# Patient Record
Sex: Female | Born: 1965 | Race: Black or African American | Hispanic: No | Marital: Married | Smoking: Never smoker
Health system: Southern US, Community
[De-identification: ages and names within clinical notes are randomized; demographics above are authoritative.]

## PROBLEM LIST (undated history)

## (undated) DIAGNOSIS — D649 Anemia, unspecified: Secondary | ICD-10-CM

## (undated) DIAGNOSIS — A64 Unspecified sexually transmitted disease: Secondary | ICD-10-CM

## (undated) HISTORY — DX: Anemia, unspecified: D64.9

## (undated) HISTORY — DX: Unspecified sexually transmitted disease: A64

---
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
## 2011-04-14 ENCOUNTER — Other Ambulatory Visit: Payer: Self-pay | Admitting: Obstetrics and Gynecology

## 2011-04-14 DIAGNOSIS — Z1231 Encounter for screening mammogram for malignant neoplasm of breast: Secondary | ICD-10-CM

## 2011-04-23 ENCOUNTER — Ambulatory Visit: Payer: Self-pay

## 2013-02-01 ENCOUNTER — Encounter: Payer: Self-pay | Admitting: Obstetrics and Gynecology

## 2013-02-15 ENCOUNTER — Ambulatory Visit (INDEPENDENT_AMBULATORY_CARE_PROVIDER_SITE_OTHER): Payer: BC Managed Care – PPO | Admitting: Obstetrics and Gynecology

## 2013-02-15 ENCOUNTER — Encounter: Payer: Self-pay | Admitting: Obstetrics and Gynecology

## 2013-02-15 ENCOUNTER — Other Ambulatory Visit: Payer: Self-pay | Admitting: Obstetrics and Gynecology

## 2013-02-15 VITALS — BP 120/78 | Ht 66.5 in | Wt 137.0 lb

## 2013-02-15 DIAGNOSIS — Z1231 Encounter for screening mammogram for malignant neoplasm of breast: Secondary | ICD-10-CM

## 2013-02-15 DIAGNOSIS — Z0000 Encounter for general adult medical examination without abnormal findings: Secondary | ICD-10-CM

## 2013-02-15 DIAGNOSIS — Z01419 Encounter for gynecological examination (general) (routine) without abnormal findings: Secondary | ICD-10-CM

## 2013-02-15 LAB — CBC
Hemoglobin: 9.3 g/dL — ABNORMAL LOW (ref 12.0–15.0)
MCH: 25.5 pg — ABNORMAL LOW (ref 26.0–34.0)
MCHC: 32.6 g/dL (ref 30.0–36.0)
MCV: 78.1 fL (ref 78.0–100.0)
RBC: 3.65 MIL/uL — ABNORMAL LOW (ref 3.87–5.11)

## 2013-02-15 NOTE — Progress Notes (Signed)
Patient ID: Tiffany Higgins, female   DOB: 08-02-1966, 47 y.o.   MRN: [id number] 47 y.o.  Married  [place] female   [phone] here for annual exam.   Menses normal.  No problems.    Patient's last menstrual period was 01/27/2013.          Sexually active: yes  The current method of family planning is vasectomy.    Exercising: No Last mammogram:  Never.  Patient will go soon.  Has had contact with office but not scheduled yet.   Last pap smear: 6years ago History of abnormal pap: no Smoking: no Alcohol: no Last colonoscopy: never Last Bone Density: never Last tetanus shot: 01-28-13 Last cholesterol check: 01-28-13.  Patient had CMP and lipid panel were normal.  Wants vitamin D and CBC.  Hgb:  PCP              Urine: Neg    No health maintenance topics applied.  Family History  Problem Relation Age of Onset  . Hyperlipidemia Father   . Hypertension Maternal Grandmother     There are no active problems to display for this patient.   Past Medical History  Diagnosis Date  . STD (sexually transmitted disease) 1988    tx'd for Chlamydia    History reviewed. No pertinent past surgical history.  Allergies: Review of patient's allergies indicates no known allergies.  No current outpatient prescriptions on file.   No current facility-administered medications for this visit.    ROS: Pertinent items are noted in HPI.  Social Hx:  Married.  Lives in [place].  4 children.  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   Exam:    BP 120/78  Ht 5' 6.5" (1.689 m)  Wt 137 lb (62.143 kg)  BMI 21.78 kg/m2  LMP 01/27/2013   Wt Readings from Last 3 Encounters:  02/15/13 137 lb (62.143 kg)     Ht Readings from Last 3 Encounters:  02/15/13 5' 6.5" (1.689 m)    General appearance: alert, cooperative and appears stated age Head: Normocephalic, without obvious abnormality, atraumatic Neck: no adenopathy, supple, symmetrical, trachea midline and thyroid not enlarged, symmetric, no  tenderness/mass/nodules Lungs: clear to auscultation bilaterally Breasts: Inspection negative, No nipple retraction or dimpling, No nipple discharge or bleeding, No axillary or supraclavicular adenopathy, Normal to palpation without dominant masses Heart: regular rate and rhythm Abdomen: soft, non-tender; bowel sounds normal; no masses,  no organomegaly Extremities: extremities normal, atraumatic, no cyanosis or edema Skin: Skin color, texture, turgor normal. No rashes or lesions.  Acne. Lymph nodes: Cervical, supraclavicular, and axillary nodes normal. No abnormal inguinal nodes palpated Neurologic: Grossly normal   Pelvic: External genitalia:  no lesions              Urethra:  normal appearing urethra with no masses, tenderness or lesions              Bartholins and Skenes: normal                 Vagina: normal appearing vagina with normal color and discharge, no lesions              Cervix: normal appearance              Pap taken: yes and high risk HPV testing requested.        Bimanual Exam:  Uterus:  uterus is normal size, shape, consistency and nontender  Adnexa: normal adnexa in size, nontender and no masses                                      Rectovaginal: Confirms                                      Anus:  normal sphincter tone, no lesions  A: normal gyn exam     [facility]  Patient prefers to do here in town.  Order in [place].  pap smear and high risk HPV testing. Vit D level and CBC return annually or prn     An After Visit Summary was printed and given to the patient.

## 2013-02-15 NOTE — Patient Instructions (Addendum)

## 2013-02-17 LAB — IBC PANEL
Ferritin: 1 ng/mL — ABNORMAL LOW (ref 10–291)
TIBC: 413 ug/dL (ref 250–470)
Transferrin: 334 mg/dL (ref 200–360)
UIBC: 403 ug/dL — ABNORMAL HIGH (ref 125–400)

## 2013-02-22 ENCOUNTER — Other Ambulatory Visit: Payer: Self-pay

## 2013-02-22 ENCOUNTER — Telehealth: Payer: Self-pay | Admitting: Obstetrics and Gynecology

## 2013-02-22 MED ORDER — VITAMIN D (ERGOCALCIFEROL) 1.25 MG (50000 UNIT) PO CAPS: 50000.0000 [IU] | ORAL_CAPSULE | Freq: Once | ORAL | Status: DC

## 2013-02-22 NOTE — Telephone Encounter (Signed)
Valentina Shaggy from [employer]" called to verify the handwriting on Silva's prescription for patient Tiffany Higgins (dob 1966-03-04; mrn [id number]) . She wanted to make sure that she followed the correct directions.

## 2013-02-23 NOTE — Telephone Encounter (Signed)
Pharmacy needed to verify directions on Vitamin D Rx escribed yesterday. Advised 1 tablet weekly x8 weeks.

## 2013-03-09 ENCOUNTER — Telehealth: Payer: Self-pay

## 2013-03-09 NOTE — Telephone Encounter (Signed)
Message copied by Alphonsa Overall on Thu Mar 09, 2013  2:12 PM ------      Message from: Conley Simmonds      Created: Thu Mar 09, 2013 10:15 AM       Please inform patient of iron deficiency anemia.  Iron studies confirm this.      Patient will need to start FeSO4 325 mg by mouth twice a day with meals.      Please have patient schedule a recheck appointment with me for 6 weeks.  I will do lab recheck then, but I actually want her to have an appointment with me also so we can discover better the source of the anemia.            Conley Simmonds ------

## 2013-03-09 NOTE — Telephone Encounter (Signed)
LMOVM to call for test results. 

## 2013-03-15 ENCOUNTER — Ambulatory Visit
Admission: RE | Admit: 2013-03-15 | Discharge: 2013-03-15 | Disposition: A | Discharge status: Home / Self Care | Payer: BC Managed Care – PPO | Source: Ambulatory Visit | Attending: Obstetrics and Gynecology | Admitting: Obstetrics and Gynecology

## 2013-03-15 DIAGNOSIS — Z1231 Encounter for screening mammogram for malignant neoplasm of breast: Secondary | ICD-10-CM

## 2013-03-20 ENCOUNTER — Telehealth: Payer: Self-pay

## 2013-03-20 NOTE — Telephone Encounter (Signed)
Message copied by Alphonsa Overall on Mon Mar 20, 2013  9:14 AM ------      Message from: Conley Simmonds      Created: Thu Mar 09, 2013 10:15 AM       Please inform patient of iron deficiency anemia.  Iron studies confirm this.      Patient will need to start FeSO4 325 mg by mouth twice a day with meals.      Please have patient schedule a recheck appointment with me for 6 weeks.  I will do lab recheck then, but I actually want her to have an appointment with me also so we can discover better the source of the anemia.            Conley Simmonds ------

## 2013-03-20 NOTE — Telephone Encounter (Signed)
LMOVM  Asking pt. To call and let me know if received hemoccults Dr. Edward Jolly had mailed to patient after last ov.

## 2013-03-21 NOTE — Telephone Encounter (Signed)
Patient received hemoccults and has mailed to office.

## 2013-03-21 NOTE — Telephone Encounter (Signed)
Patient notified of labs and has f/u appointment 03-31-13.

## 2013-03-31 ENCOUNTER — Ambulatory Visit: Payer: BC Managed Care – PPO | Admitting: Obstetrics and Gynecology

## 2013-04-13 ENCOUNTER — Ambulatory Visit (INDEPENDENT_AMBULATORY_CARE_PROVIDER_SITE_OTHER): Payer: BC Managed Care – PPO | Admitting: Obstetrics and Gynecology

## 2013-04-13 ENCOUNTER — Encounter: Payer: Self-pay | Admitting: Obstetrics and Gynecology

## 2013-04-13 VITALS — BP 120/60 | HR 80 | Wt 140.0 lb

## 2013-04-13 DIAGNOSIS — D509 Iron deficiency anemia, unspecified: Secondary | ICD-10-CM

## 2013-04-13 LAB — CBC
MCH: 26.1 pg (ref 26.0–34.0)
MCHC: 32.6 g/dL (ref 30.0–36.0)
MCV: 80 fL (ref 78.0–100.0)
Platelets: 254 10*3/uL (ref 150–400)
RDW: 19.3 % — ABNORMAL HIGH (ref 11.5–15.5)
WBC: 4.1 10*3/uL (ref 4.0–10.5)

## 2013-04-13 LAB — IBC PANEL: %SAT: 8 % — ABNORMAL LOW (ref 20–55)

## 2013-04-13 NOTE — Patient Instructions (Signed)

## 2013-04-13 NOTE — Progress Notes (Signed)
Patient ID: Tiffany Higgins, female   DOB: 26-Aug-1966, 47 y.o.   MRN: [id number]  [facility]  Patient here for follow up of iron deficiency anemia noted at last office visit of 02/15/13.   CBC - Hgb 9.3, Fe level 10 Taking Hemocyte 1/6 mg couple of times a week. Feels fatigued lately.  Menses every 28 days, last 5 days.  Pad change every 4 hours or so. No clotting.    No intestinal problesm with diarrhea or difficulty tolerating foods.   Dietary choices - lots of vegetables including spinach, broccoli.  Rare red meet.  Eats a lot of poultry.   Eats only one full meal a day.  Asking about normal mammogram which showed dense breast tissue.   Objective  No exam.  Hemoccult negative  Assessment  Iron deficiency anemia - dietary in nature.  Dense breast tissue.  Plan  Repeat CBC and iron studies today. Reviewed dietary sources of iron. I assume patient will need to continue on iron, which may also be in the form of multivitamin. I reviewed signs and symptoms of anemia with patient. I discussed density of the breasts and how this may make it more difficult to detect breast cancer with mammogram. I recommend tomosynthesis next year. Do self breast exams. Have yearly clinical breast exam.

## 2013-04-14 ENCOUNTER — Other Ambulatory Visit: Payer: Self-pay | Admitting: Obstetrics and Gynecology

## 2013-04-14 DIAGNOSIS — D509 Iron deficiency anemia, unspecified: Secondary | ICD-10-CM

## 2013-04-17 ENCOUNTER — Telehealth: Payer: Self-pay

## 2013-04-17 ENCOUNTER — Other Ambulatory Visit: Payer: Self-pay

## 2013-04-17 DIAGNOSIS — E559 Vitamin D deficiency, unspecified: Secondary | ICD-10-CM

## 2013-04-17 NOTE — Telephone Encounter (Signed)
Patient notified hemoglobin and iron levels low and needs to have rechecked in 3 months.  Patient also needs to have Vitamin D level rechecked in 3 months.  She will call back in 3 months to make lab appointment.

## 2013-04-17 NOTE — Telephone Encounter (Signed)
LMOVM at HM# and Cell # to call for test results. 

## 2013-04-17 NOTE — Telephone Encounter (Signed)
Message copied by Alphonsa Overall on Mon Apr 17, 2013 10:40 AM ------      Message from: Conley Simmonds      Created: Fri Apr 14, 2013  6:05 AM       Please contact patient with results.  Hemoglobin and iron levels are still very low.        Please encourage patient to take her iron daily!        I recommend rechecking again in 3 months.      I will place an order in Epic for labs.  Needs lab visit only. ------

## 2014-07-04 ENCOUNTER — Telehealth: Payer: Self-pay

## 2014-07-04 NOTE — Telephone Encounter (Signed)
Message copied by Jannet Askew on Wed Jul 04, 2014  9:56 AM ------      Message from: [facility] Gwenevere Ghazi, BROOK E      Created: Tue Jul 03, 2014  3:24 PM      Regarding: please contact patient to schedule an annual exam and labs.       Please contact patient to schedule an annual exam and labs.       She was seen last year and diagnosed with anemia, and she did not follow up.            Thanks!            ----- Message -----         From: SYSTEM         Sent: 06/25/2014  12:01 AM           To: Brook E Amundson de Gwenevere Ghazi, MD                   ------

## 2014-07-04 NOTE — Telephone Encounter (Signed)
Left message to call Kaitlyn at [phone]. 

## 2014-07-05 NOTE — Telephone Encounter (Signed)
Spoke with patient. Patient is agreeable to schedule at this time. Appointment scheduled for tomorrow at 11am with Dr.Silva. Agreeable to date and time.  Routing to provider for final review. Patient agreeable to disposition. Will close encounter

## 2014-07-06 ENCOUNTER — Ambulatory Visit: Payer: BC Managed Care – PPO | Admitting: Obstetrics and Gynecology

## 2014-08-13 ENCOUNTER — Encounter: Payer: Self-pay | Admitting: Obstetrics and Gynecology

## 2015-08-20 ENCOUNTER — Other Ambulatory Visit: Payer: Self-pay

## 2015-08-20 DIAGNOSIS — Z1231 Encounter for screening mammogram for malignant neoplasm of breast: Secondary | ICD-10-CM

## 2015-09-19 ENCOUNTER — Ambulatory Visit
Admission: RE | Admit: 2015-09-19 | Discharge: 2015-09-19 | Disposition: A | Discharge status: Home / Self Care | Payer: Managed Care, Other (non HMO) | Source: Ambulatory Visit

## 2015-09-19 DIAGNOSIS — Z1231 Encounter for screening mammogram for malignant neoplasm of breast: Secondary | ICD-10-CM

## 2015-09-20 ENCOUNTER — Encounter: Payer: Self-pay | Admitting: Certified Nurse Midwife

## 2015-09-20 ENCOUNTER — Ambulatory Visit (INDEPENDENT_AMBULATORY_CARE_PROVIDER_SITE_OTHER): Payer: Managed Care, Other (non HMO) | Admitting: Certified Nurse Midwife

## 2015-09-20 ENCOUNTER — Ambulatory Visit: Payer: Self-pay

## 2015-09-20 VITALS — BP 114/64 | HR 68 | Resp 16 | Ht 66.75 in | Wt 138.0 lb

## 2015-09-20 DIAGNOSIS — Z01419 Encounter for gynecological examination (general) (routine) without abnormal findings: Secondary | ICD-10-CM | POA: Diagnosis not present

## 2015-09-20 DIAGNOSIS — Z0000 Encounter for general adult medical examination without abnormal findings: Secondary | ICD-10-CM

## 2015-09-20 DIAGNOSIS — Z124 Encounter for screening for malignant neoplasm of cervix: Secondary | ICD-10-CM | POA: Diagnosis not present

## 2015-09-20 DIAGNOSIS — D649 Anemia, unspecified: Secondary | ICD-10-CM | POA: Diagnosis not present

## 2015-09-20 LAB — LIPID PANEL
CHOL/HDL RATIO: 2.3 ratio (ref <=5.0)
CHOLESTEROL: 186 mg/dL (ref 125–200)
HDL: 82 mg/dL (ref >=46)
LDL Cholesterol: 95 mg/dL (ref <130)
Triglycerides: 47 mg/dL (ref <150)
VLDL: 9 mg/dL (ref <30)

## 2015-09-20 LAB — CBC
HCT: 31.9 % — ABNORMAL LOW (ref 36.0–46.0)
HEMOGLOBIN: 10.1 g/dL — AB (ref 12.0–15.0)
MCH: 25.4 pg — AB (ref 26.0–34.0)
MCHC: 31.7 g/dL (ref 30.0–36.0)
MCV: 80.2 fL (ref 78.0–100.0)
MPV: 9.8 fL (ref 8.6–12.4)
PLATELETS: 305 10*3/uL (ref 150–400)
RBC: 3.98 MIL/uL (ref 3.87–5.11)
RDW: 17.8 % — AB (ref 11.5–15.5)
WBC: 3.2 10*3/uL — ABNORMAL LOW (ref 4.0–10.5)

## 2015-09-20 LAB — COMPREHENSIVE METABOLIC PANEL WITH GFR
ALT: 10 U/L (ref 6–29)
AST: 14 U/L (ref 10–35)
Albumin: 4.1 g/dL (ref 3.6–5.1)
Alkaline Phosphatase: 40 U/L (ref 33–115)
BUN: 14 mg/dL (ref 7–25)
CO2: 27 mmol/L (ref 20–31)
Calcium: 9.3 mg/dL (ref 8.6–10.2)
Chloride: 107 mmol/L (ref 98–110)
Creat: 0.94 mg/dL (ref 0.50–1.10)
Glucose, Bld: 74 mg/dL (ref 65–99)
Potassium: 4.3 mmol/L (ref 3.5–5.3)
Sodium: 141 mmol/L (ref 135–146)
Total Bilirubin: 0.4 mg/dL (ref 0.2–1.2)
Total Protein: 7.1 g/dL (ref 6.1–8.1)

## 2015-09-20 LAB — FERRITIN: FERRITIN: 6 ng/mL — AB (ref 10–291)

## 2015-09-20 LAB — POCT URINALYSIS DIPSTICK
Bilirubin, UA: NEGATIVE
Blood, UA: NEGATIVE
Glucose, UA: NEGATIVE
Ketones, UA: NEGATIVE
Leukocytes, UA: NEGATIVE
Nitrite, UA: NEGATIVE
Protein, UA: NEGATIVE
Urobilinogen, UA: NEGATIVE
pH, UA: 5

## 2015-09-20 LAB — VITAMIN D 25 HYDROXY (VIT D DEFICIENCY, FRACTURES): Vit D, 25-Hydroxy: 11 ng/mL — ABNORMAL LOW (ref 30–100)

## 2015-09-20 LAB — IBC PANEL
%SAT: 8 % — AB (ref 11–50)
TIBC: 395 ug/dL (ref 250–450)
UIBC: 365 ug/dL (ref 125–400)

## 2015-09-20 LAB — IRON: IRON: 30 ug/dL — AB (ref 40–190)

## 2015-09-20 MED ORDER — FUSION PLUS PO CAPS: 1.0000 | ORAL_CAPSULE | Freq: Two times a day (BID) | ORAL | Status: DC

## 2015-09-20 NOTE — Patient Instructions (Signed)
EXERCISE AND DIET:  We recommended that you start or continue a regular exercise program for good health. Regular exercise means any activity that makes your heart beat faster and makes you sweat.  We recommend exercising at least 30 minutes per day at least 3 days a week, preferably 4 or 5.  We also recommend a diet low in fat and sugar.  Inactivity, poor dietary choices and obesity can cause diabetes, heart attack, stroke, and kidney damage, among others.    ALCOHOL AND SMOKING:  Women should limit their alcohol intake to no more than 7 drinks/beers/glasses of wine (combined, not each!) per week. Moderation of alcohol intake to this level decreases your risk of breast cancer and liver damage. And of course, no recreational drugs are part of a healthy lifestyle.  And absolutely no smoking or even second hand smoke. Most people know smoking can cause heart and lung diseases, but did you know it also contributes to weakening of your bones? Aging of your skin?  Yellowing of your teeth and nails?  CALCIUM AND VITAMIN D:  Adequate intake of calcium and Vitamin D are recommended.  The recommendations for exact amounts of these supplements seem to change often, but generally speaking 600 mg of calcium (either carbonate or citrate) and 800 units of Vitamin D per day seems prudent. Certain women may benefit from higher intake of Vitamin D.  If you are among these women, your doctor will have told you during your visit.    PAP SMEARS:  Pap smears, to check for cervical cancer or precancers,  have traditionally been done yearly, although recent scientific advances have shown that most women can have pap smears less often.  However, every woman still should have a physical exam from her gynecologist every year. It will include a breast check, inspection of the vulva and vagina to check for abnormal growths or skin changes, a visual exam of the cervix, and then an exam to evaluate the size and shape of the uterus and  ovaries.  And after 47 years of age, a rectal exam is indicated to check for rectal cancers. We will also provide age appropriate advice regarding health maintenance, like when you should have certain vaccines, screening for sexually transmitted diseases, bone density testing, colonoscopy, mammograms, etc.   MAMMOGRAMS:  All women over 40 years old should have a yearly mammogram. Many facilities now offer a "3D" mammogram, which may cost around $50 extra out of pocket. If possible,  we recommend you accept the option to have the 3D mammogram performed.  It both reduces the number of women who will be called back for extra views which then turn out to be normal, and it is better than the routine mammogram at detecting truly abnormal areas.    COLONOSCOPY:  Colonoscopy to screen for colon cancer is recommended for all women at age 50.  We know, you hate the idea of the prep.  We agree, BUT, having colon cancer and not knowing it is worse!!  Colon cancer so often starts as a polyp that can be seen and removed at colonscopy, which can quite literally save your life!  And if your first colonoscopy is normal and you have no family history of colon cancer, most women don't have to have it again for 10 years.  Once every ten years, you can do something that may end up saving your life, right?  We will be happy to help you get it scheduled when you are ready.    Be sure to check your insurance coverage so you understand how much it will cost.  It may be covered as a preventative service at no cost, but you should check your particular policy.     Iron Deficiency Anemia, Adult Anemia is a condition in which there are less red blood cells or hemoglobin in the blood than normal. Hemoglobin is the part of red blood cells that carries oxygen. Iron deficiency anemia is anemia caused by too little iron. It is the most common type of anemia. It may leave you tired and short of breath. CAUSES   Lack of iron in the  diet.  Poor absorption of iron, as seen with intestinal disorders.  Intestinal bleeding.  Heavy periods. SIGNS AND SYMPTOMS  Mild anemia may not be noticeable. Symptoms may include:  Fatigue.  Headache.  Pale skin.  Weakness.  Tiredness.  Shortness of breath.  Dizziness.  Cold hands and feet.  Fast or irregular heartbeat. DIAGNOSIS  Diagnosis requires a thorough evaluation and physical exam by your health care provider. Blood tests are generally used to confirm iron deficiency anemia. Additional tests may be done to find the underlying cause of your anemia. These may include:  Testing for blood in the stool (fecal occult blood test).  A procedure to see inside the colon and rectum (colonoscopy).  A procedure to see inside the esophagus and stomach (endoscopy). TREATMENT  Iron deficiency anemia is treated by correcting the cause of the deficiency. Treatment may involve:  Adding iron-rich foods to your diet.  Taking iron supplements. Pregnant or breastfeeding women need to take extra iron because their normal diet usually does not provide the required amount.  Taking vitamins. Vitamin C improves the absorption of iron. Your health care provider may recommend that you take your iron tablets with a glass of orange juice or vitamin C supplement.  Medicines to make heavy menstrual flow lighter.  Surgery. HOME CARE INSTRUCTIONS   Take iron as directed by your health care provider.  If you cannot tolerate taking iron supplements by mouth, talk to your health care provider about taking them through a vein (intravenously) or an injection into a muscle.  For the best iron absorption, iron supplements should be taken on an empty stomach. If you cannot tolerate them on an empty stomach, you may need to take them with food.  Do not drink milk or take antacids at the same time as your iron supplements. Milk and antacids may interfere with the absorption of iron.  Iron  supplements can cause constipation. Make sure to include fiber in your diet to prevent constipation. A stool softener may also be recommended.  Take vitamins as directed by your health care provider.  Eat a diet rich in iron. Foods high in iron include liver, lean beef, whole-grain bread, eggs, dried fruit, and dark green leafy vegetables. SEEK IMMEDIATE MEDICAL CARE IF:   You faint. If this happens, do not drive. Call your local emergency services (911 in [place] if no other help is available.  You have chest pain.  You feel nauseous or vomit.  You have severe or increased shortness of breath with activity.  You feel weak.  You have a rapid heartbeat.  You have unexplained sweating.  You become light-headed when getting up from a chair or bed. MAKE SURE YOU:   Understand these instructions.  Will watch your condition.  Will get help right away if you are not doing well or get worse.   This information   is not intended to replace advice given to you by your health care provider. Make sure you discuss any questions you have with your health care provider.   Document Released: 09/25/2000 Document Revised: 10/19/2014 Document Reviewed: 06/05/2013 Elsevier Interactive Patient Education 2016 Elsevier Inc.  

## 2015-09-20 NOTE — Progress Notes (Signed)
49 y.o. G5L4 Married  African American Fe here for annual exam. Periods normal, no issues.  Sees [facility] if needed with PCP there. [place] and [place] Patient feels she is anemic again, tries to work on iron, but difficult. Denies blood in stool or tarry stools. History of anemia, chronic.Desires screening labs today. No other health issues today.   Patient's last menstrual period was 09/13/2015.          Sexually active: Yes.    The current method of family planning is vasectomy.    Exercising: No.  exercise Smoker:  no  Health Maintenance: Pap:  02-15-13 ASCUS HPV HR neg MMG: 03-15-13 category 4 density,birads 2:neg, mammo yesterday Colonoscopy: none BMD:   none TDaP:  2014 Labs: poct urine-neg, hgb-9.6 Self breast exam: done occ   reports that she has never smoked. She does not have any smokeless tobacco history on file. She reports that she does not drink alcohol or use illicit drugs.  Past Medical History  Diagnosis Date  . STD (sexually transmitted disease) 1988    tx'd for Chlamydia    History reviewed. No pertinent past surgical history.  Current Outpatient Prescriptions  Medication Sig Dispense Refill  . Iron-FA-B Cmp-C-Biot-Probiotic (FUSION PLUS) CAPS Take 1 capsule by mouth 2 (two) times daily at 8 am and 10 pm. 60 capsule 4   No current facility-administered medications for this visit.    Family History  Problem Relation Age of Onset  . Hyperlipidemia Father   . Hypertension Maternal Grandmother     ROS:  Pertinent items are noted in HPI.  Otherwise, a comprehensive ROS was negative.  Exam:   BP 114/64 mmHg  Pulse 68  Resp 16  Ht 5' 6.75" (1.695 m)  Wt 138 lb (62.596 kg)  BMI 21.79 kg/m2  LMP 09/13/2015 Height: 5' 6.75" (169.5 cm) Ht Readings from Last 3 Encounters:  09/20/15 5' 6.75" (1.695 m)  02/15/13 5' 6.5" (1.689 m)    General appearance: alert, cooperative and appears stated age Head: Normocephalic, without obvious  abnormality, atraumatic Neck: no adenopathy, supple, symmetrical, trachea midline and thyroid normal to inspection and palpation Lungs: clear to auscultation bilaterally Breasts: normal appearance, no masses or tenderness, No nipple retraction or dimpling, No nipple discharge or bleeding, No axillary or supraclavicular adenopathy Heart: regular rate and rhythm Abdomen: soft, non-tender; no masses,  no organomegaly Extremities: extremities normal, atraumatic, no cyanosis or edema Skin: Skin color, texture, turgor normal. No rashes or lesions Lymph nodes: Cervical, supraclavicular, and axillary nodes normal. No abnormal inguinal nodes palpated Neurologic: Grossly normal   Pelvic: External genitalia:  no lesions              Urethra:  normal appearing urethra with no masses, tenderness or lesions              Bartholin's and Skene's: normal                 Vagina: normal appearing vagina with normal color and discharge, no lesions              Cervix: normal,non tender,no lesions              Pap taken: Yes.   Bimanual Exam:  Uterus:  normal size, contour, position, consistency, mobility, non-tender              Adnexa: normal adnexa and no mass, fullness, tenderness  Rectovaginal: Confirms               Anus:  normal sphincter tone, no lesions  Chaperone present: yes   A:  Well Woman with normal exam  Contraception spouse vasectomy  History of anemia, poor compliance with OTC use  Screening labs  P:   Reviewed health and wellness pertinent to exam  Discussed iron foods and importance of working on good iron choices daily.   Labs:IBC,Iron, Ferritin , CBC  Rx Fusion Plus see order with instructions  Labs: Lipid panel, CMP, Vitamin D  Pap smear as above taken with HPVHR   counseled on breast self exam, mammography screening, adequate intake of calcium and vitamin D, diet and exercise  return annually or prn  An After Visit Summary was printed and given to the  patient.

## 2015-09-23 ENCOUNTER — Other Ambulatory Visit: Payer: Self-pay | Admitting: Obstetrics and Gynecology

## 2015-09-23 DIAGNOSIS — R928 Other abnormal and inconclusive findings on diagnostic imaging of breast: Secondary | ICD-10-CM

## 2015-09-24 LAB — HEMOGLOBIN, FINGERSTICK: HEMOGLOBIN, FINGERSTICK: 9.6 g/dL — AB (ref 12.0–16.0)

## 2015-09-24 LAB — IPS PAP TEST WITH HPV

## 2015-09-25 NOTE — Progress Notes (Signed)
Reviewed personally.  M. Suzanne Cintia Gleed, MD.  

## 2015-09-27 ENCOUNTER — Other Ambulatory Visit: Payer: Self-pay | Admitting: Certified Nurse Midwife

## 2015-09-27 ENCOUNTER — Ambulatory Visit
Admission: RE | Admit: 2015-09-27 | Discharge: 2015-09-27 | Disposition: A | Discharge status: Home / Self Care | Payer: Managed Care, Other (non HMO) | Source: Ambulatory Visit | Attending: Obstetrics and Gynecology | Admitting: Obstetrics and Gynecology

## 2015-09-27 ENCOUNTER — Telehealth: Payer: Self-pay

## 2015-09-27 DIAGNOSIS — R928 Other abnormal and inconclusive findings on diagnostic imaging of breast: Secondary | ICD-10-CM

## 2015-09-27 DIAGNOSIS — D509 Iron deficiency anemia, unspecified: Secondary | ICD-10-CM

## 2015-09-27 NOTE — Telephone Encounter (Signed)
-----   Message from Verner Choleborah S Leonard, CNM sent at 09/27/2015  7:33 AM EST ----- Pap smear reviewed negative, HPVHR not detected 02 Notify patient that Vitamin D is very low start on Rx protocol Lipid panel is normal Liver, kidney and glucose normal profile Iron level is 30 should be 40 or greater Saturation is 8 normal greater than 11 Ferritin 6 greater than 10 is normal CBC shows low WBC count and anemia profile Take Fusion plus as ordered, work on iron foods and vitamin C with iron for better absorption Recheck iron in 6 weeks order in please schedule

## 2015-09-27 NOTE — Telephone Encounter (Signed)
lmtcb

## 2015-09-30 ENCOUNTER — Other Ambulatory Visit: Payer: Self-pay

## 2015-09-30 DIAGNOSIS — E559 Vitamin D deficiency, unspecified: Secondary | ICD-10-CM

## 2015-09-30 MED ORDER — VITAMIN D (ERGOCALCIFEROL) 1.25 MG (50000 UNIT) PO CAPS: 50000.0000 [IU] | ORAL_CAPSULE | ORAL | Status: DC

## 2015-09-30 NOTE — Telephone Encounter (Signed)
Patient notified of results. See lab 

## 2015-09-30 NOTE — Telephone Encounter (Signed)
rx sent to lab for vit d. See labwork

## 2016-01-16 ENCOUNTER — Telehealth: Payer: Self-pay

## 2016-01-16 NOTE — Telephone Encounter (Signed)
Called patient to see about getting her scheduled for her labwork regarding iron studies testing & vitamin d recheck that patient had not done. Pt states she will have to look at her schedule & callback to make appt. Pt aware of importance of follow up & why these should be rechecked. Please close encounter when done.

## 2016-08-19 ENCOUNTER — Telehealth: Payer: Self-pay | Admitting: Certified Nurse Midwife

## 2016-08-19 NOTE — Telephone Encounter (Signed)
Spoke with patient. Patient states that 2 weeks ago she began to have right breast tenderness. Patient found a lump in the outer portion of her right breast. Denies any swelling, redness, warmth, or nipple discharge. Advised she will need to be seen in the office for further evaluation. Patient is agreeable. Requesting an appointment for 08/21/2016. Appointment scheduled for 08/21/2016 at 12:45 pm with Leota Sauerseborah Leonard CNM. Patient is agreeable to date and time.  Routing to provider for final review. Patient agreeable to disposition. Will close encounter.

## 2016-08-19 NOTE — Telephone Encounter (Signed)
Patient is having some right breast tenderness and wants to speak with the nurse.

## 2016-08-21 ENCOUNTER — Encounter: Payer: Self-pay | Admitting: Certified Nurse Midwife

## 2016-08-21 ENCOUNTER — Ambulatory Visit: Payer: Managed Care, Other (non HMO) | Admitting: Certified Nurse Midwife

## 2016-08-21 VITALS — BP 110/70 | HR 68 | Resp 16 | Ht 66.75 in | Wt 141.0 lb

## 2016-08-21 DIAGNOSIS — N644 Mastodynia: Secondary | ICD-10-CM | POA: Diagnosis not present

## 2016-08-21 DIAGNOSIS — N6310 Unspecified lump in the right breast, unspecified quadrant: Secondary | ICD-10-CM | POA: Diagnosis not present

## 2016-08-21 NOTE — Progress Notes (Signed)
   Subjective:   50 y.o. MarriedAfrican American female presents for evaluation of right breast mass. Onset of the symptoms was one week ago. Patient sought evaluation because of breast lump and breast tenderness.  Contributing factors include none. Denies chills, fevers.. Patient denies hiistory of trauma, bites, or injuries. Last mammogram was 12/16 with small cyst noted at 8 o'clock..   Review of Systems Per HPI pertinent to above   Objective:   General appearance: alert, cooperative, appears stated age and no distress Breasts: normal appearance, no masses or tenderness, No nipple retraction or dimpling, No nipple discharge or bleeding, No axillary or supraclavicular adenopathy   Assessment:  ASSESSMENT:Patient is diagnosed with breast mass, likely benign and  and slightly tender   Plan:   PLAN: Discussed patient may be same area noted on mammogram, but feel evaluation is needed. Patient agreeable to diagnostic mammogram and US. Will schedule before leaving today.

## 2016-08-21 NOTE — Progress Notes (Signed)
Patient to schedule diagnostic MMG and US of right breast while in office for breast pain and mass. Order placed. [facility]. Patient states she needs early morning -offered 08/26/16 at 0810, patient declined; offered 08/27/16 at 0750, patient declined. Patient states she will need to check with her work schedule and return call for scheduling. Advised Amber would provide patient with contact information for [facility]. Patient provided contact information and advised to call [facility] directly for scheduling or return call to office and RN can assist with scheduling. Patient verbalizes understanding and is agreeable.

## 2016-08-28 NOTE — Progress Notes (Signed)
Encounter reviewed Tiffany Bergin, MD   

## 2016-10-01 ENCOUNTER — Ambulatory Visit: Payer: Managed Care, Other (non HMO) | Admitting: Certified Nurse Midwife

## 2016-10-21 ENCOUNTER — Telehealth: Payer: Self-pay | Admitting: Certified Nurse Midwife

## 2016-10-21 NOTE — Telephone Encounter (Signed)
[facility] in November 2017 for a breast mass and breast pain to have an ultrasound. Their scheduler spoke with patient who declined appointment at that time and stated she would call to schedule. Reviewing referral and appointment desk, patient never scheduled this ultrasound.  Additionaly, it was also noted she cancelled her annual exam with Leota Sauerseborah Leonard on 10-01-16 due to her cycle and has not rescheduled to date.   Routing to triage to update and follow up with patient.

## 2016-10-21 NOTE — Telephone Encounter (Signed)
Left message to call Shany Marinez at [phone].  

## 2016-10-27 NOTE — Telephone Encounter (Signed)
Left message to call Beda Dula at [phone].  

## 2016-10-30 NOTE — Telephone Encounter (Signed)
Leota Sauerseborah Leonard, CNM -Attempted to contact patient x2 with no return call, please advise?  Cc: Billie RuddySally Yeakley

## 2016-10-30 NOTE — Telephone Encounter (Signed)
I think she needs letter regarding concern for health and follow up,  Dr Hyacinth MeekerMiller agree?

## 2016-11-20 ENCOUNTER — Encounter: Payer: Self-pay | Admitting: Obstetrics & Gynecology

## 2016-11-20 NOTE — Telephone Encounter (Signed)
Reviewed letter. Printed and to Dr.Miller for signature before mailing.

## 2016-11-20 NOTE — Telephone Encounter (Signed)
I've written a letter that should be adequate.  Please proof and print and I will sign it for the mail.  I will CC Debbi Darcel BayleyLeonard on this as well as FYI.

## 2016-12-15 NOTE — Telephone Encounter (Signed)
Dr.Miller, okay to close this encounter?

## 2016-12-16 NOTE — Telephone Encounter (Signed)
Encounter closed

## 2017-02-11 ENCOUNTER — Telehealth: Payer: Self-pay | Admitting: Certified Nurse Midwife

## 2017-02-11 DIAGNOSIS — N6310 Unspecified lump in the right breast, unspecified quadrant: Secondary | ICD-10-CM

## 2017-02-11 DIAGNOSIS — N644 Mastodynia: Secondary | ICD-10-CM

## 2017-02-11 NOTE — Telephone Encounter (Signed)
Orders for bilateral diagnostic mammogram with right breast ultrasound placed via EPIC to [facility].  Routing to provider for final review. Patient agreeable to disposition. Will close encounter.

## 2017-02-11 NOTE — Telephone Encounter (Signed)
[facility] requesting a new order for a bilateral diagnostic mammogram since the patient is due for her annual screening as well.   [facility] at the [facility] time [facility] same thing.

## 2017-02-11 NOTE — Telephone Encounter (Signed)
Yes, she needs to have done

## 2017-02-11 NOTE — Telephone Encounter (Signed)
Tiffany Higgins CNM patient never scheduled R breast diagnostic mammogram that was ordered on 08/21/2016 by our office for right breast mass. Patient is now due for screening mammogram. Okay to order bilateral diagnostic mammogram with right breast ultrasound?

## 2017-03-01 ENCOUNTER — Ambulatory Visit
Admission: RE | Admit: 2017-03-01 | Discharge: 2017-03-01 | Disposition: A | Discharge status: Home / Self Care | Payer: BLUE CROSS/BLUE SHIELD | Source: Ambulatory Visit | Attending: Certified Nurse Midwife | Admitting: Certified Nurse Midwife

## 2017-03-01 DIAGNOSIS — N644 Mastodynia: Secondary | ICD-10-CM

## 2017-03-01 DIAGNOSIS — N6310 Unspecified lump in the right breast, unspecified quadrant: Secondary | ICD-10-CM

## 2017-04-05 ENCOUNTER — Encounter: Payer: Self-pay | Admitting: Certified Nurse Midwife

## 2018-07-26 ENCOUNTER — Telehealth: Payer: Self-pay | Admitting: *Deleted

## 2018-07-26 NOTE — Telephone Encounter (Signed)
Patient in for 04 recall for 02/2018.  Needs Screening MMG scheduled.

## 2018-07-28 ENCOUNTER — Other Ambulatory Visit: Payer: Self-pay | Admitting: Obstetrics and Gynecology

## 2018-07-28 DIAGNOSIS — Z1231 Encounter for screening mammogram for malignant neoplasm of breast: Secondary | ICD-10-CM

## 2018-07-28 NOTE — Telephone Encounter (Signed)
Spoke with patient. Patient states she will call & schedule mmg appt. I also scheduled patient for an annual appt. For 08-03-18. Patient will try to have mmg done before her aex appt with [place] if possible.

## 2018-08-03 ENCOUNTER — Ambulatory Visit: Payer: BLUE CROSS/BLUE SHIELD | Admitting: Certified Nurse Midwife

## 2018-08-22 NOTE — Telephone Encounter (Signed)
Per Epic appointment desk, MMG scheduled at [facility] for 09-05-18. Recall updated to 09-10-18.  Routing to Dr Hyacinth Meeker. Encounter closed.

## 2018-09-05 ENCOUNTER — Ambulatory Visit
Admission: RE | Admit: 2018-09-05 | Discharge: 2018-09-05 | Disposition: A | Discharge status: Home / Self Care | Payer: 59 | Source: Ambulatory Visit | Attending: Obstetrics and Gynecology | Admitting: Obstetrics and Gynecology

## 2018-09-05 DIAGNOSIS — Z1231 Encounter for screening mammogram for malignant neoplasm of breast: Secondary | ICD-10-CM

## 2018-09-15 ENCOUNTER — Ambulatory Visit (INDEPENDENT_AMBULATORY_CARE_PROVIDER_SITE_OTHER): Payer: 59 | Admitting: Certified Nurse Midwife

## 2018-09-15 ENCOUNTER — Encounter: Payer: Self-pay | Admitting: Certified Nurse Midwife

## 2018-09-15 ENCOUNTER — Other Ambulatory Visit: Payer: Self-pay

## 2018-09-15 ENCOUNTER — Other Ambulatory Visit (HOSPITAL_COMMUNITY)
Admission: RE | Admit: 2018-09-15 | Discharge: 2018-09-15 | Disposition: A | Discharge status: Home / Self Care | Payer: 59 | Source: Ambulatory Visit | Attending: Certified Nurse Midwife | Admitting: Certified Nurse Midwife

## 2018-09-15 VITALS — BP 104/64 | HR 68 | Resp 16 | Ht 67.25 in | Wt 141.0 lb

## 2018-09-15 DIAGNOSIS — Z1211 Encounter for screening for malignant neoplasm of colon: Secondary | ICD-10-CM

## 2018-09-15 DIAGNOSIS — E559 Vitamin D deficiency, unspecified: Secondary | ICD-10-CM | POA: Diagnosis not present

## 2018-09-15 DIAGNOSIS — Z0000 Encounter for general adult medical examination without abnormal findings: Secondary | ICD-10-CM

## 2018-09-15 DIAGNOSIS — Z124 Encounter for screening for malignant neoplasm of cervix: Secondary | ICD-10-CM

## 2018-09-15 DIAGNOSIS — Z01419 Encounter for gynecological examination (general) (routine) without abnormal findings: Secondary | ICD-10-CM | POA: Diagnosis not present

## 2018-09-15 NOTE — Progress Notes (Signed)
52 y.o. [id number] Married [place] female here for annual exam. Periods changing occasional late period. Duration same, lighter. Sees PCP yearly, but has not been seen this year. Desires screening labs today. Recent mammogram, no change in cyst in right breast per patient. Does SBE and still can feel area at times. Denies other health issues today. Son working on [employer] applications and offers for football team! No other health issues today.  Patient's last menstrual period was 08/12/2018 (exact date).          Sexually active: Yes.    The current method of family planning is vasectomy.    Exercising: No.  exercise Smoker:  no  Health Maintenance: Pap:  09-20-15 neg HPV HR neg History of abnormal Pap:  no MMG:  09-05-18 category c density birads 1:neg Colonoscopy:  none BMD:   none TDaP:  2014 Pneumonia vaccine(s):  no Shingrix:   no Hep C testing: not done Screening Labs: yes   reports that she has never smoked. She has never used smokeless tobacco. She reports that she does not drink alcohol or use drugs.  Past Medical History:  Diagnosis Date  . STD (sexually transmitted disease) 1988   tx'd for Chlamydia    History reviewed. No pertinent surgical history.  No current outpatient medications on file.   No current facility-administered medications for this visit.     Family History  Problem Relation Age of Onset  . Hyperlipidemia Father   . Heart attack Father   . Hypertension Maternal Grandmother     Review of Systems  Exam:   BP 104/64   Pulse 68   Resp 16   Ht 5' 7.25" (1.708 m)   Wt 141 lb (64 kg)   LMP 08/12/2018 (Exact Date)   BMI 21.92 kg/m   Height:   Height: 5' 7.25" (170.8 cm)  Ht Readings from Last 3 Encounters:  09/15/18 5' 7.25" (1.708 m)  08/21/16 5' 6.75" (1.695 m)  09/20/15 5' 6.75" (1.695 m)    General appearance: alert, cooperative and appears stated age Head: Normocephalic, without obvious abnormality, atraumatic Neck: no  adenopathy, supple, symmetrical, trachea midline and thyroid normal to inspection and palpation Lungs: clear to auscultation bilaterally Breasts: normal appearance, no masses or tenderness, No nipple retraction or dimpling, No nipple discharge or bleeding, No axillary or supraclavicular adenopathy. Breast cyst still palpated at 9 o'clock,  in right breast, this is the same area noted before, no change. Heart: regular rate and rhythm Abdomen: soft, non-tender; bowel sounds normal; no masses,  no organomegaly Extremities: extremities normal, atraumatic, no cyanosis or edema Skin: Skin color, texture, turgor normal. No rashes or lesions Lymph nodes: Cervical, supraclavicular, and axillary nodes normal. No abnormal inguinal nodes palpated Neurologic: Grossly normal   Pelvic: External genitalia:  no lesions              Urethra:  normal appearing urethra with no masses, tenderness or lesions              Bartholins and Skenes: normal                 Vagina: normal appearing vagina with normal color and discharge, no lesions              Cervix: multiparous appearance, no cervical motion tenderness and no lesions              Pap taken: Yes.   Bimanual Exam:  Uterus:  normal size, contour, position,  consistency, mobility, non-tender and anteverted              Adnexa: normal adnexa and no mass, fullness, tenderness               Rectovaginal: Confirms               Anus:  normal sphincter tone, no lesions  Chaperone was present for exam.  A:  Well Woman with normal exam  Contraception spouse vasectomy  Menstrual change with regularity, and lighter  History of right breast cyst no change per patient with palpation and mammogram  Screening labs  Colonoscopy due  P:   Discussed normal exam finding.  Discussed perimenopausal changes included menstrual cycle changes. Given menstrual calendar with information to call if no period in 3 months and other information to be aware of. Questions  addressed.  Discussed breast cyst still palpated in right breast and feels smaller. Continue SBE and advise if changes. Mammogram yearly  Discussed risks/benefits of colonoscopy. Patient would like referral to schedule in early 2020. Will be referred to Dr Loreta Ave and she will be called with information regarding.  Labs: CMP, Lipid panel, Vitamin D, CBC, TSH, Hep c  Pap smear taken   counseled on breast self exam, mammography screening, menopause, adequate intake of calcium and vitamin D, diet and exercise  return annually or prn

## 2018-09-16 LAB — LIPID PANEL
CHOLESTEROL TOTAL: 194 mg/dL (ref 100–199)
Chol/HDL Ratio: 2.3 ratio (ref 0.0–4.4)
HDL: 86 mg/dL (ref >39)
LDL CALC: 96 mg/dL (ref 0–99)
Triglycerides: 62 mg/dL (ref 0–149)
VLDL Cholesterol Cal: 12 mg/dL (ref 5–40)

## 2018-09-16 LAB — CBC
Hematocrit: 36.8 % (ref 34.0–46.6)
Hemoglobin: 11.4 g/dL (ref 11.1–15.9)
MCH: 27.7 pg (ref 26.6–33.0)
MCHC: 31 g/dL — ABNORMAL LOW (ref 31.5–35.7)
MCV: 89 fL (ref 79–97)
PLATELETS: 255 10*3/uL (ref 150–450)
RBC: 4.12 x10E6/uL (ref 3.77–5.28)
RDW: 15.7 % — AB (ref 12.3–15.4)
WBC: 3.2 10*3/uL — AB (ref 3.4–10.8)

## 2018-09-16 LAB — COMPREHENSIVE METABOLIC PANEL
A/G RATIO: 1.7 (ref 1.2–2.2)
ALK PHOS: 51 IU/L (ref 39–117)
ALT: 26 IU/L (ref 0–32)
AST: 26 IU/L (ref 0–40)
Albumin: 4.6 g/dL (ref 3.5–5.5)
BILIRUBIN TOTAL: 0.3 mg/dL (ref 0.0–1.2)
BUN/Creatinine Ratio: 15 (ref 9–23)
BUN: 13 mg/dL (ref 6–24)
CALCIUM: 9.5 mg/dL (ref 8.7–10.2)
CHLORIDE: 106 mmol/L (ref 96–106)
CO2: 27 mmol/L (ref 20–29)
Creatinine, Ser: 0.86 mg/dL (ref 0.57–1.00)
GFR calc Af Amer: 91 mL/min/{1.73_m2} (ref >59)
GFR calc non Af Amer: 79 mL/min/{1.73_m2} (ref >59)
Globulin, Total: 2.7 g/dL (ref 1.5–4.5)
Glucose: 77 mg/dL (ref 65–99)
POTASSIUM: 4.1 mmol/L (ref 3.5–5.2)
SODIUM: 145 mmol/L — AB (ref 134–144)
TOTAL PROTEIN: 7.3 g/dL (ref 6.0–8.5)

## 2018-09-16 LAB — HEPATITIS C ANTIBODY

## 2018-09-16 LAB — VITAMIN D 25 HYDROXY (VIT D DEFICIENCY, FRACTURES): VIT D 25 HYDROXY: 14.7 ng/mL — AB (ref 30.0–100.0)

## 2018-09-16 LAB — TSH: TSH: 1.15 u[IU]/mL (ref 0.450–4.500)

## 2018-09-18 ENCOUNTER — Other Ambulatory Visit: Payer: Self-pay | Admitting: Certified Nurse Midwife

## 2018-09-18 DIAGNOSIS — R899 Unspecified abnormal finding in specimens from other organs, systems and tissues: Secondary | ICD-10-CM

## 2018-09-18 DIAGNOSIS — E559 Vitamin D deficiency, unspecified: Secondary | ICD-10-CM

## 2018-09-19 LAB — CYTOLOGY - PAP
Diagnosis: NEGATIVE
HPV: NOT DETECTED

## 2018-09-20 ENCOUNTER — Other Ambulatory Visit: Payer: Self-pay

## 2018-09-20 NOTE — Telephone Encounter (Signed)
Notes recorded by Verner CholLeonard, Deborah S, CNM on 09/19/2018 at 8:24 PM EST Pap smear reviewed negative HPV not detected 02 ------  Notes recorded by Verner CholLeonard, Deborah S, CNM on 09/18/2018 at 9:16 PM EST Notify patient her Vitamin D is very low at 14.7 she needs to start on Rx 50,000 once weekly for 3 months and recheck. This can increase fatigue and depression and decrease calcium absorption for bones order placed for recheck TSH is normal Lipid panel is normal, good cholesterol at 194, LDL at 96 and HDL at 86, triglycerides at 62 Liver, kidney and glucose essentially normal CBC shows borderline low WBC which can occur with anemia history, Hgb. 11.4 would recommend starting on Multivitamin with iron daily. Recheck level with Vitamin D Please schedule  Hep C is normal  Left message for callback.

## 2018-09-21 MED ORDER — VITAMIN D (ERGOCALCIFEROL) 1.25 MG (50000 UNIT) PO CAPS: 50000.0000 [IU] | ORAL_CAPSULE | ORAL | 0 refills | Status: DC

## 2018-09-21 NOTE — Telephone Encounter (Signed)
Patient notified rx sent to pharmacy

## 2018-12-22 ENCOUNTER — Other Ambulatory Visit: Payer: 59

## 2019-03-16 ENCOUNTER — Other Ambulatory Visit: Payer: 59

## 2019-03-24 ENCOUNTER — Other Ambulatory Visit: Payer: Self-pay

## 2019-03-28 ENCOUNTER — Other Ambulatory Visit (INDEPENDENT_AMBULATORY_CARE_PROVIDER_SITE_OTHER): Payer: 59

## 2019-03-28 ENCOUNTER — Other Ambulatory Visit: Payer: Self-pay | Admitting: *Deleted

## 2019-03-28 ENCOUNTER — Other Ambulatory Visit: Payer: Self-pay

## 2019-03-28 DIAGNOSIS — E559 Vitamin D deficiency, unspecified: Secondary | ICD-10-CM

## 2019-03-28 DIAGNOSIS — R899 Unspecified abnormal finding in specimens from other organs, systems and tissues: Secondary | ICD-10-CM

## 2019-03-29 LAB — CBC
Hematocrit: 33.4 % — ABNORMAL LOW (ref 34.0–46.6)
Hemoglobin: 11.5 g/dL (ref 11.1–15.9)
MCH: 29.7 pg (ref 26.6–33.0)
MCHC: 34.4 g/dL (ref 31.5–35.7)
MCV: 86 fL (ref 79–97)
Platelets: 242 10*3/uL (ref 150–450)
RBC: 3.87 x10E6/uL (ref 3.77–5.28)
RDW: 13.8 % (ref 11.7–15.4)
WBC: 5.1 10*3/uL (ref 3.4–10.8)

## 2019-03-29 LAB — VITAMIN D 25 HYDROXY (VIT D DEFICIENCY, FRACTURES): Vit D, 25-Hydroxy: 36.8 ng/mL (ref 30.0–100.0)

## 2019-07-04 ENCOUNTER — Telehealth: Payer: Self-pay | Admitting: Certified Nurse Midwife

## 2019-07-04 NOTE — Telephone Encounter (Signed)
Patient has some questions about blood work that she had done in the past.  She is specifically asking if she had a gluten test?

## 2019-07-04 NOTE — Telephone Encounter (Signed)
Spoke with patient. Patient states she had a consult with Dr. Collene Mares today, asking if she has ever had any gluten allergy testing with our office. Advised per review of Epic no gluten allergy blood testing completed, advised . Patient will also f/u with her PCP. Questions answered.   Routing to provider for final review. Patient is agreeable to disposition. Will close encounter.

## 2019-08-28 ENCOUNTER — Telehealth: Payer: Self-pay | Admitting: Certified Nurse Midwife

## 2019-08-28 NOTE — Telephone Encounter (Signed)
Left message to call Revonda Menter, RN at [facility] [phone].   

## 2019-08-28 NOTE — Telephone Encounter (Signed)
Patient sent the following message through [facility]. Routing to triage to assist patient with request.  [place], Katelan Hirt [facility]  Phone Number: [phone]        I called in but was not able to hold. I pressed the number to leave a message but for some reason, I was not directed to an answering machine. I have discovered a lump in my left breast and the area is tender. Please advise on if I need to come in or if I need to schedule an appointment with [facility]. I can be reached on my cell at [phone]. Thank you.   Angela Nevin Saindon

## 2019-08-28 NOTE — Telephone Encounter (Signed)
Spoke with patient. Patient reports tender, left breast lump. Noticed on Saturday, requesting OV. Denies fever/chills, skin changes, nipple d/c. Last screening MMG 09/06/18.   [id number] prescreen negative, precautions reviewed.   OV scheduled for 08/29/19 at 8am with Melvia Heaps, CNM.   Routing to provider for final review. Patient is agreeable to disposition. Will close encounter.

## 2019-08-29 ENCOUNTER — Other Ambulatory Visit: Payer: Self-pay

## 2019-08-29 ENCOUNTER — Telehealth: Payer: Self-pay | Admitting: *Deleted

## 2019-08-29 ENCOUNTER — Ambulatory Visit: Payer: 59 | Admitting: Certified Nurse Midwife

## 2019-08-29 ENCOUNTER — Encounter: Payer: Self-pay | Admitting: Certified Nurse Midwife

## 2019-08-29 VITALS — BP 102/62 | HR 68 | Temp 97.1°F | Resp 16 | Wt 150.0 lb

## 2019-08-29 DIAGNOSIS — N6320 Unspecified lump in the left breast, unspecified quadrant: Secondary | ICD-10-CM

## 2019-08-29 NOTE — Patient Instructions (Signed)
Breast Cyst  A breast cyst is a sac in the breast that is filled with fluid. Breast cysts are usually noncancerous (benign). They are common among women, and they are most often located in the upper, outer portion of the breast. One or more cysts may develop. They form when fluid builds up inside of the breast glands. There are several types of breast cysts:  Macrocyst. This is a cyst that is about 2 inches (5.1 cm) across (in diameter).  Microcyst. This is a very small cyst that you cannot feel, but it can be seen with imaging tests such as an X-ray of the breast (mammogram) or ultrasound.  Galactocele. This is a cyst that contains milk. It may develop if you suddenly stop breastfeeding. Breast cysts do not increase your risk of breast cancer. They usually disappear after menopause, unless you take artificial hormones (are on hormone therapy). What are the causes? The exact cause of breast cysts is not known. Possible causes include:  Blockage of tubes (ducts) in the breast glands, which leads to fluid buildup. Duct blockage may result from: ? Fibrocystic breast changes. This is a common, benign condition that occurs when women go through hormonal changes during the menstrual cycle. This is a common cause of multiple breast cysts. ? Overgrowth of breast tissue or breast glands. ? Scar tissue in the breast from previous surgery.  Changes in certain female hormones (estrogen and progesterone). What increases the risk? You may be more likely to develop breast cysts if you have not gone through menopause. What are the signs or symptoms? Symptoms of a breast cyst may include:  Feeling one or more smooth, round, soft lumps (like grapes) in the breast that are easily moveable. The lump(s) may get bigger and more painful before your period and get smaller after your period.  Breast discomfort or pain. How is this diagnosed? A cyst can be felt during a physical exam by your health care provider.  A mammogram and ultrasound will be done to confirm the diagnosis. Fluid may be removed from the cyst with a needle (fine-needle aspiration) and tested to make sure the cyst is not cancerous. How is this treated? Treatment may not be necessary. Your health care provider may monitor the cyst to see if it goes away on its own. If the cyst is uncomfortable or gets bigger, or if you do not like how the cyst makes your breast look, you may need treatment. Treatment may include:  Hormone treatment.  Fine-needle aspiration, to drain fluid from the cyst. There is a chance of the cyst coming back (recurring) after aspiration.  Surgery to remove the cyst. Follow these instructions at home:  See your health care provider regularly. ? Get a yearly physical exam. ? If you are 20-40 years old, get a clinical breast exam every 1-3 years. After age 40, get this exam every year. ? Get mammograms as often as directed.  Do a breast self-exam every month, or as often as directed. Having many breast cysts, or "lumpy" breasts, may make it harder to feel for new lumps. Understand how your breasts normally look and feel, and write down any changes in your breasts so you can tell your health care provider about the changes. A breast self-exam involves: ? Comparing your breasts in the mirror. ? Looking for visible changes in your skin or nipples. ? Feeling for lumps or changes.  Take over-the-counter and prescription medicines only as told by your health care provider.  Wear   a supportive bra, especially when exercising.  Follow instructions from your health care provider about eating and drinking restrictions. ? Avoid caffeine. ? Cut down on salt (sodium) in what you eat and drink, especially before your menstrual period. Too much sodium can cause fluid buildup (retention), breast swelling, and discomfort.  Keep all follow-up visits as told your health care provider. This is important. Contact a health care  provider if:  You feel, or think you feel, a lump in your breast.  You notice that both breasts look or feel different than usual.  Your breast is still causing pain after your menstrual period is over.  You find new lumps or bumps that were not there before.  You feel lumps in your armpit (axilla). Get help right away if:  You have severe pain, tenderness, redness, or warmth in your breast.  You have fluid or blood leaking from your nipple.  Your breast lump becomes hard and painful.  You notice dimpling or wrinkling of the breast or nipple. This information is not intended to replace advice given to you by your health care provider. Make sure you discuss any questions you have with your health care provider. Document Released: 09/28/2005 Document Revised: 09/10/2017 Document Reviewed: 06/19/2016 Elsevier Patient Education  2020 Elsevier Inc.  

## 2019-08-29 NOTE — Telephone Encounter (Signed)
Last screening MMG 09/05/18. Order placed for bilateral Dx MMG and [place], if needed, at [facility]. Spoke with Joaquim Lai, scheduled for 09/11/19 at 8am, arrive at 7:40am.   Spoke with patient, notified of appt. Patient is agreeable to date and time.   Patient placed in MMG hold.   Routing to provider for final review. Patient is agreeable to disposition. Will close encounter.

## 2019-08-29 NOTE — Telephone Encounter (Signed)
-----   Message from Regina Eck, CNM sent at 08/29/2019  8:40 AM EST ----- Patient needs diagnostic mammogram and [place] of left breast see note. Previous history of in right breast.She is aware she will be called with appointment.

## 2019-08-29 NOTE — Progress Notes (Signed)
   Subjective:   53 y.o. Married [place] American female presents for evaluation of left breast mass. Onset of the symptoms was several days ago. Patient sought evaluation due to tenderness and palpated mass in left breast. Previous history of cyst in right breast noted 03/01/2017 with diagnostic mammogram and [place]. Denies any nipple discharge or redness. Feels larger than area noted in right breast. No trauma to area that she is aware of. No other health issues today.Perimenopausal with period changes.  Review of Systems Pertinent items noted in HPI and remainder of comprehensive ROS otherwise negative.@SUBJECTIVE    Objective:   General appearance: alert, cooperative and appears stated age  Breasts: No nipple retraction or dimpling, No nipple discharge or bleeding, No axillary or supraclavicular adenopathy, normal appearance bilateral, on right at 9 o'clock palpable masses consistent with previous finding of cyst. On left breast at 2 o'clock into aerola approximately 4-5 cm in diameter cystic feel mass, no redness slightly tender, patient agrees this is the area she noted  Assessment:   ASSESSMENT:Patient is diagnosed with Left breast mass cystic feel  History of right breast cysts.   Plan:   PLAN: Discussed with patient need for evaluation. She is agreeable. She will be called with appointment for diagnostic mammogram and [place]. Warning signs of redness, chills or fever reviewed and need to advise if occurs.  Rv prn

## 2019-09-11 ENCOUNTER — Ambulatory Visit
Admission: RE | Admit: 2019-09-11 | Discharge: 2019-09-11 | Disposition: A | Discharge status: Home / Self Care | Payer: 59 | Source: Ambulatory Visit | Attending: Certified Nurse Midwife | Admitting: Certified Nurse Midwife

## 2019-09-11 ENCOUNTER — Other Ambulatory Visit: Payer: Self-pay

## 2019-09-11 DIAGNOSIS — N6320 Unspecified lump in the left breast, unspecified quadrant: Secondary | ICD-10-CM

## 2019-10-03 ENCOUNTER — Telehealth: Payer: Self-pay | Admitting: Certified Nurse Midwife

## 2019-10-03 ENCOUNTER — Encounter: Payer: Self-pay | Admitting: Certified Nurse Midwife

## 2019-10-03 ENCOUNTER — Ambulatory Visit: Payer: Self-pay | Admitting: Certified Nurse Midwife

## 2019-10-03 NOTE — Progress Notes (Deleted)
53 y.o. [id number] Married  {Race/ethnicity:17218} Fe here for annual exam.    No LMP recorded. (Menstrual status: Other).          Sexually active: {yes no:314532}  The current method of family planning is vasectomy.    Exercising: {yes no:314532}  {types:19826} Smoker:  {YES NO:22349}  ROS  Health Maintenance: Pap:  09-15-18 neg HPV HR neg History of Abnormal Pap: {YES NO:22349} MMG:  09-10-18 bilateral & left breast u/s category c density birads 2:neg Self Breast exams: {YES NO:22349} Colonoscopy:  2020 BMD:   *** TDaP:  2014 Shingles: *** Pneumonia: *** Hep C and HIV: hep c neg 2019 Labs: ***   reports that she has never smoked. She has never used smokeless tobacco. She reports that she does not drink alcohol or use drugs.  Past Medical History:  Diagnosis Date  . STD (sexually transmitted disease) 1988   tx'd for Chlamydia    No past surgical history on file.  No current outpatient medications on file.   No current facility-administered medications for this visit.    Family History  Problem Relation Age of Onset  . Hyperlipidemia Father   . Heart attack Father   . Hypertension Maternal Grandmother     ROS:  Pertinent items are noted in HPI.  Otherwise, a comprehensive ROS was negative.  Exam:   There were no vitals taken for this visit.   Ht Readings from Last 3 Encounters:  09/15/18 5' 7.25" (1.708 m)  08/21/16 5' 6.75" (1.695 m)  09/20/15 5' 6.75" (1.695 m)    General appearance: alert, cooperative and appears stated age Head: Normocephalic, without obvious abnormality, atraumatic Neck: no adenopathy, supple, symmetrical, trachea midline and thyroid {EXAM; THYROID:18604} Lungs: clear to auscultation bilaterally Breasts: {Exam; breast:13139::"normal appearance, no masses or tenderness"} Heart: regular rate and rhythm Abdomen: soft, non-tender; no masses,  no organomegaly Extremities: extremities normal, atraumatic, no cyanosis or edema Skin: Skin  color, texture, turgor normal. No rashes or lesions Lymph nodes: Cervical, supraclavicular, and axillary nodes normal. No abnormal inguinal nodes palpated Neurologic: Grossly normal   Pelvic: External genitalia:  no lesions              Urethra:  normal appearing urethra with no masses, tenderness or lesions              Bartholin's and Skene's: normal                 Vagina: normal appearing vagina with normal color and discharge, no lesions              Cervix: {exam; cervix:14595}              Pap taken: {yes no:314532} Bimanual Exam:  Uterus:  {exam; uterus:12215}              Adnexa: {exam; adnexa:12223}               Rectovaginal: Confirms               Anus:  normal sphincter tone, no lesions  Chaperone present: ***  A:  Well Woman with normal exam  P:   Reviewed health and wellness pertinent to exam  Pap smear: {YES NO:22349}  {plan; gyn:5269::"mammogram","pap smear","return annually or prn"}  An After Visit Summary was printed and given to the patient.

## 2019-10-03 NOTE — Telephone Encounter (Signed)
Patient [facility] this morning's appointment. Letter sent.

## 2019-11-06 ENCOUNTER — Other Ambulatory Visit: Payer: Self-pay

## 2019-11-07 ENCOUNTER — Ambulatory Visit: Payer: 59 | Admitting: Certified Nurse Midwife

## 2019-11-07 ENCOUNTER — Encounter: Payer: Self-pay | Admitting: Certified Nurse Midwife

## 2019-11-07 ENCOUNTER — Other Ambulatory Visit: Payer: Self-pay

## 2019-11-07 VITALS — BP 120/78 | HR 68 | Temp 98.2°F | Resp 16 | Ht 66.5 in | Wt 146.0 lb

## 2019-11-07 DIAGNOSIS — Z862 Personal history of diseases of the blood and blood-forming organs and certain disorders involving the immune mechanism: Secondary | ICD-10-CM

## 2019-11-07 DIAGNOSIS — N951 Menopausal and female climacteric states: Secondary | ICD-10-CM

## 2019-11-07 DIAGNOSIS — Z01419 Encounter for gynecological examination (general) (routine) without abnormal findings: Secondary | ICD-10-CM | POA: Diagnosis not present

## 2019-11-07 DIAGNOSIS — Z0000 Encounter for general adult medical examination without abnormal findings: Secondary | ICD-10-CM | POA: Diagnosis not present

## 2019-11-07 DIAGNOSIS — E559 Vitamin D deficiency, unspecified: Secondary | ICD-10-CM

## 2019-11-07 NOTE — Patient Instructions (Signed)

## 2019-11-07 NOTE — Progress Notes (Signed)
54 y.o. [id number] Married  African American Fe here for annual exam. Last period was 07/2019. Had slight spotting 10/24/2019.Marland Kitchen Denies hot flashes or night sweats or vaginal dryness. Emotionally doing well during Covid. Trying to eat well and exercise. Saw GI for colonoscopy all normal. No other health issues today.  Patient's last menstrual period was 10/24/2019.  Spotting only no more than one day.       Sexually active: Yes.    The current method of family planning is vasectomy.    Exercising: Yes.    some Smoker:  no  Review of Systems  Constitutional: Negative.   HENT: Negative.   Eyes: Negative.   Respiratory: Negative.   Cardiovascular: Negative.   Gastrointestinal: Negative.   Genitourinary: Negative.   Musculoskeletal: Negative.   Skin: Negative.   Neurological: Negative.   Endo/Heme/Allergies: Negative.   Psychiatric/Behavioral: Negative.     Health Maintenance: Pap:  09-15-18 neg HPV HR neg History of Abnormal Pap: no MMG:  09-11-2019 bilateral & left breast u/s category c density birads 2:neg Self Breast exams: yes Colonoscopy:  2020 BMD:   none TDaP:  2014 Shingles: no Pneumonia: no Hep C and HIV: hep c neg 2019 Labs: yes   reports that she has never smoked. She has never used smokeless tobacco. She reports that she does not drink alcohol or use drugs.  Past Medical History:  Diagnosis Date  . STD (sexually transmitted disease) 1988   tx'd for Chlamydia    History reviewed. No pertinent surgical history.  No current outpatient medications on file.   No current facility-administered medications for this visit.    Family History  Problem Relation Age of Onset  . Hyperlipidemia Father   . Heart attack Father   . Hypertension Maternal Grandmother     ROS:  Pertinent items are noted in HPI.  Otherwise, a comprehensive ROS was negative.  Exam:   BP 120/78   Pulse 68   Temp 98.2 F (36.8 C) (Skin)   Resp 16   Ht 5' 6.5" (1.689 m)   Wt 146 lb (66.2  kg)   LMP 10/24/2019 Comment: spotting  BMI 23.21 kg/m  Height: 5' 6.5" (168.9 cm) Ht Readings from Last 3 Encounters:  11/07/19 5' 6.5" (1.689 m)  09/15/18 5' 7.25" (1.708 m)  08/21/16 5' 6.75" (1.695 m)    General appearance: alert, cooperative and appears stated age Head: Normocephalic, without obvious abnormality, atraumatic Neck: no adenopathy, supple, symmetrical, trachea midline and thyroid normal to inspection and palpation Lungs: clear to auscultation bilaterally Breasts: normal appearance, no masses or tenderness, No nipple retraction or dimpling, No nipple discharge or bleeding, No axillary or supraclavicular adenopathy Heart: regular rate and rhythm Abdomen: soft, non-tender; no masses,  no organomegaly Extremities: extremities normal, atraumatic, no cyanosis or edema Skin: Skin color, texture, turgor normal. No rashes or lesions Lymph nodes: Cervical, supraclavicular, and axillary nodes normal. No abnormal inguinal nodes palpated Neurologic: Grossly normal   Pelvic: External genitalia:  no lesions              Urethra:  normal appearing urethra with no masses, tenderness or lesions              Bartholin's and Skene's: normal                 Vagina: normal appearing vagina with normal color and discharge, no lesions              Cervix: no cervical motion tenderness, no  lesions and normal appearance              Pap taken: No. Bimanual Exam:  Uterus:  normal size, contour, position, consistency, mobility, non-tender and anteverted              Adnexa: normal adnexa and no mass, fullness, tenderness               Rectovaginal: Confirms               Anus:  normal sphincter tone, no lesions  Chaperone present: yes  A:  Well Woman with normal exam  Perimenopausal with period change.  History of anemia  Screening labs  P:   Reviewed health and wellness pertinent to exam  Discussed perimenopause/menopause, expectations and bleeding profile and need to keep a  calendar of periods and advise if no cycle in next 3 months. Futures trader given. Questions addressed.  Lab: FSH, Prolactin  Encouraged to be sure include iron in diet.   Screening labs: CBC, TSH, Lipid panel, CMP, Vitamin D, TIBC  Pap smear: no  counseled on breast self exam, mammography screening, feminine hygiene, menopause, adequate intake of calcium and vitamin D, diet and exercise  return annually or prn  An After Visit Summary was printed and given to the patient.

## 2019-11-08 LAB — LIPID PANEL
Chol/HDL Ratio: 2.4 ratio (ref 0.0–4.4)
Cholesterol, Total: 226 mg/dL — ABNORMAL HIGH (ref 100–199)
HDL: 93 mg/dL (ref >39)
LDL Chol Calc (NIH): 123 mg/dL — ABNORMAL HIGH (ref 0–99)
Triglycerides: 59 mg/dL (ref 0–149)
VLDL Cholesterol Cal: 10 mg/dL (ref 5–40)

## 2019-11-08 LAB — COMPREHENSIVE METABOLIC PANEL
ALT: 15 IU/L (ref 0–32)
AST: 18 IU/L (ref 0–40)
Albumin/Globulin Ratio: 1.8 (ref 1.2–2.2)
Albumin: 4.9 g/dL (ref 3.8–4.9)
Alkaline Phosphatase: 68 IU/L (ref 39–117)
BUN/Creatinine Ratio: 13 (ref 9–23)
BUN: 11 mg/dL (ref 6–24)
Bilirubin Total: 0.4 mg/dL (ref 0.0–1.2)
CO2: 26 mmol/L (ref 20–29)
Calcium: 9.5 mg/dL (ref 8.7–10.2)
Chloride: 102 mmol/L (ref 96–106)
Creatinine, Ser: 0.85 mg/dL (ref 0.57–1.00)
GFR calc Af Amer: 92 mL/min/{1.73_m2} (ref >59)
GFR calc non Af Amer: 80 mL/min/{1.73_m2} (ref >59)
Globulin, Total: 2.8 g/dL (ref 1.5–4.5)
Glucose: 78 mg/dL (ref 65–99)
Potassium: 4.3 mmol/L (ref 3.5–5.2)
Sodium: 140 mmol/L (ref 134–144)
Total Protein: 7.7 g/dL (ref 6.0–8.5)

## 2019-11-08 LAB — CBC
Hematocrit: 39.9 % (ref 34.0–46.6)
Hemoglobin: 13.3 g/dL (ref 11.1–15.9)
MCH: 30 pg (ref 26.6–33.0)
MCHC: 33.3 g/dL (ref 31.5–35.7)
MCV: 90 fL (ref 79–97)
Platelets: 272 10*3/uL (ref 150–450)
RBC: 4.44 x10E6/uL (ref 3.77–5.28)
RDW: 13.1 % (ref 11.7–15.4)
WBC: 4.3 10*3/uL (ref 3.4–10.8)

## 2019-11-08 LAB — IRON AND TIBC
Iron Saturation: 29 % (ref 15–55)
Iron: 101 ug/dL (ref 27–159)
Total Iron Binding Capacity: 353 ug/dL (ref 250–450)
UIBC: 252 ug/dL (ref 131–425)

## 2019-11-08 LAB — VITAMIN D 25 HYDROXY (VIT D DEFICIENCY, FRACTURES): Vit D, 25-Hydroxy: 19.1 ng/mL — ABNORMAL LOW (ref 30.0–100.0)

## 2019-11-08 LAB — TSH: TSH: 0.779 u[IU]/mL (ref 0.450–4.500)

## 2019-11-09 ENCOUNTER — Other Ambulatory Visit: Payer: Self-pay | Admitting: Certified Nurse Midwife

## 2019-11-09 DIAGNOSIS — E559 Vitamin D deficiency, unspecified: Secondary | ICD-10-CM

## 2019-11-09 DIAGNOSIS — N951 Menopausal and female climacteric states: Secondary | ICD-10-CM

## 2019-11-10 LAB — SPECIMEN STATUS REPORT

## 2019-11-10 LAB — FOLLICLE STIMULATING HORMONE: FSH: 120 m[IU]/mL

## 2019-11-10 LAB — PROLACTIN: Prolactin: 4.2 ng/mL — ABNORMAL LOW (ref 4.8–23.3)

## 2019-11-22 IMAGING — MG DIGITAL SCREENING BILATERAL MAMMOGRAM WITH TOMO AND CAD
8 series · 9 of 24 positions shown · non-contrast
Comparison: Previous exam(s).

CLINICAL DATA: Screening.

EXAM:
DIGITAL SCREENING BILATERAL MAMMOGRAM WITH TOMO AND CAD

[R MLO synth-2D]
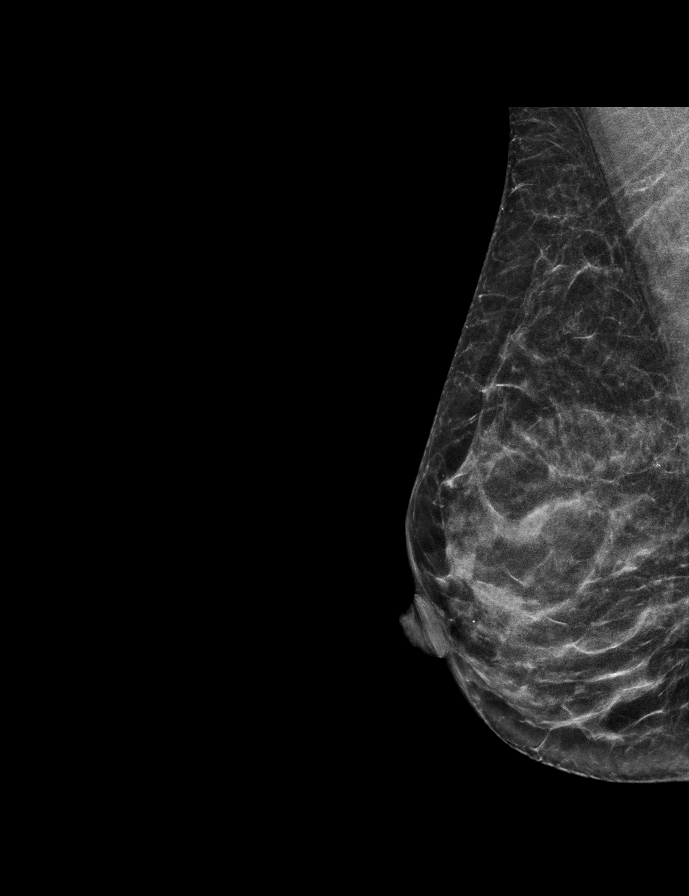

[L MLO synth-2D]
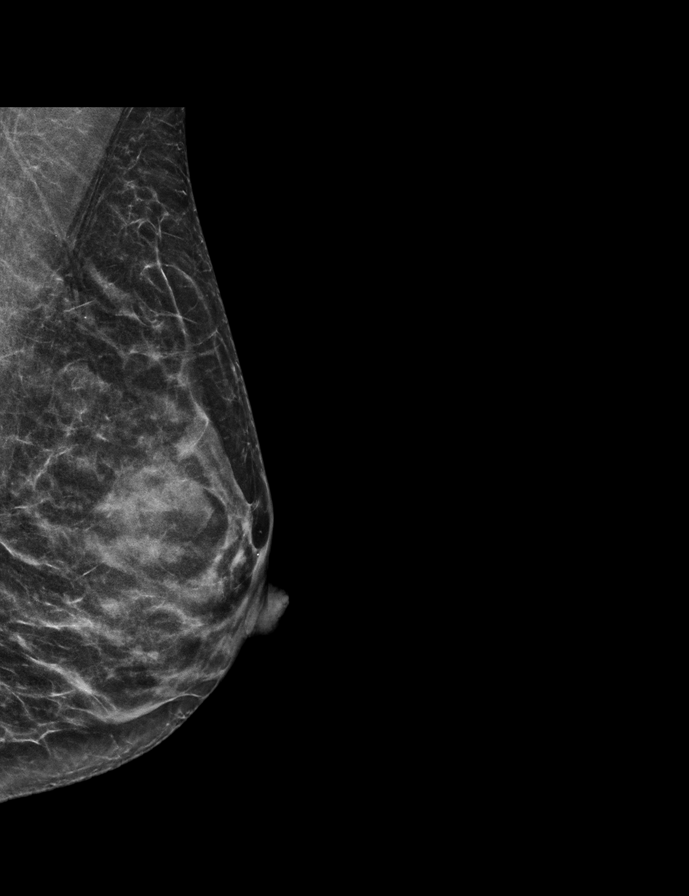

[L CC synth-2D]
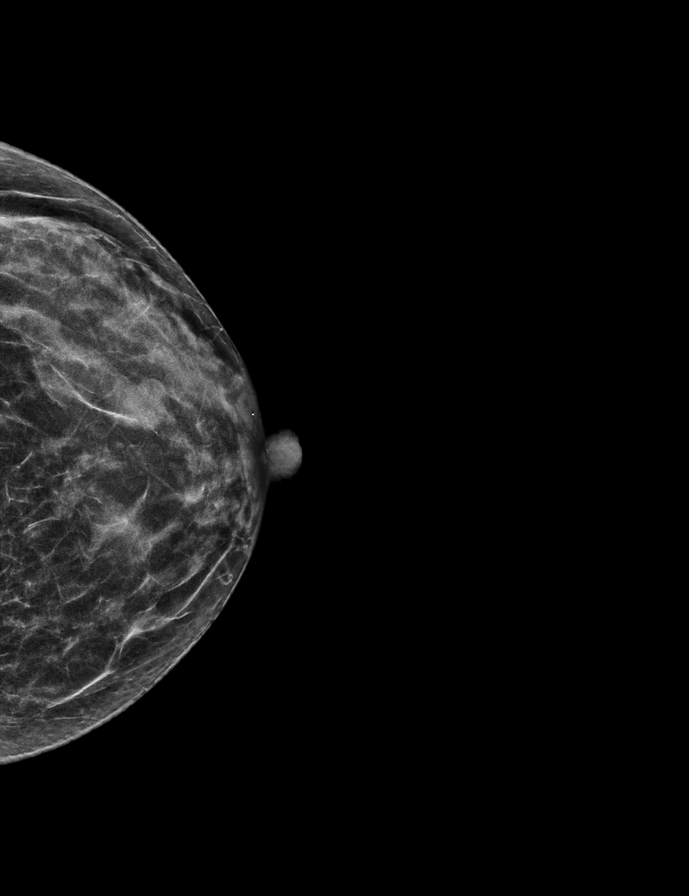

[R CC synth-2D]
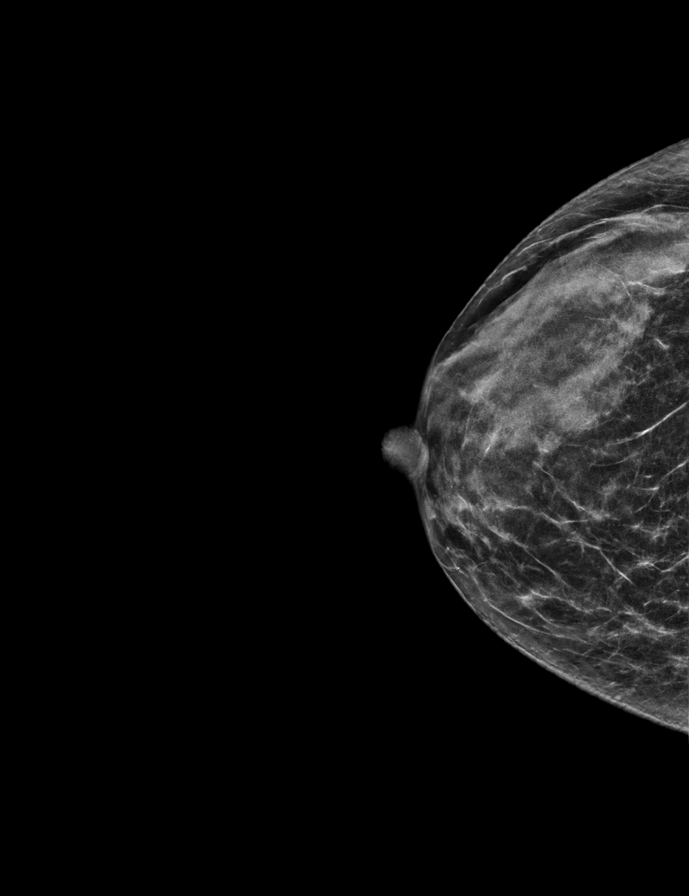

[L MLO tomo · 2 of 50 frames shown]
[frame 17/50]
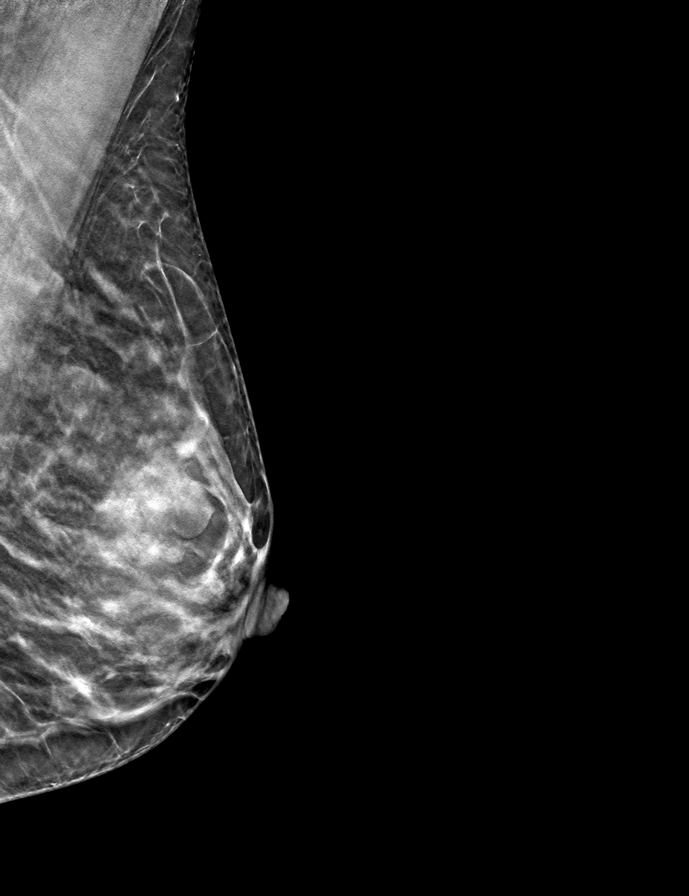
[frame 25/50]
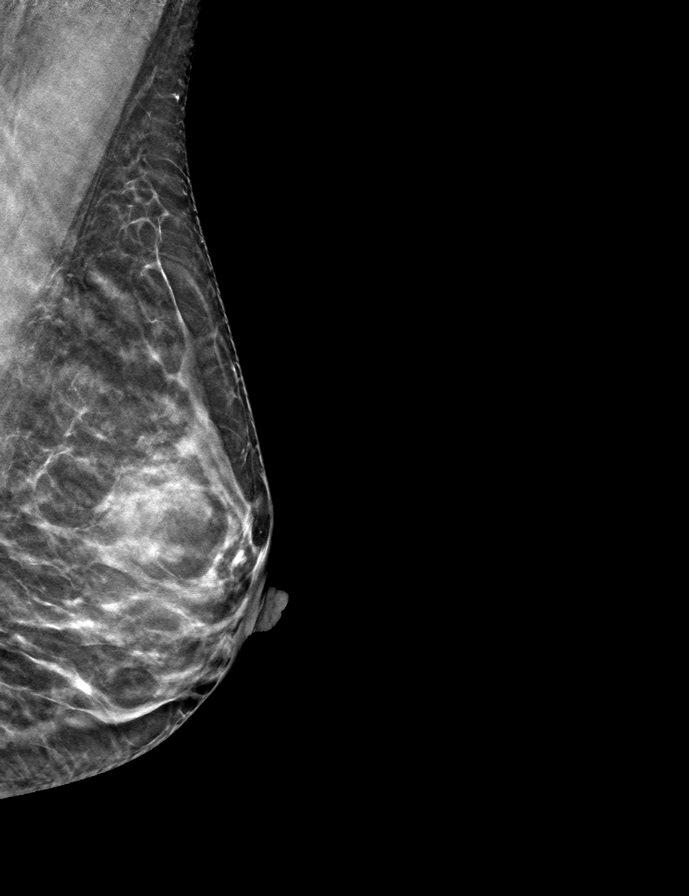

[R CC tomo · tomo slice 24/47.0]
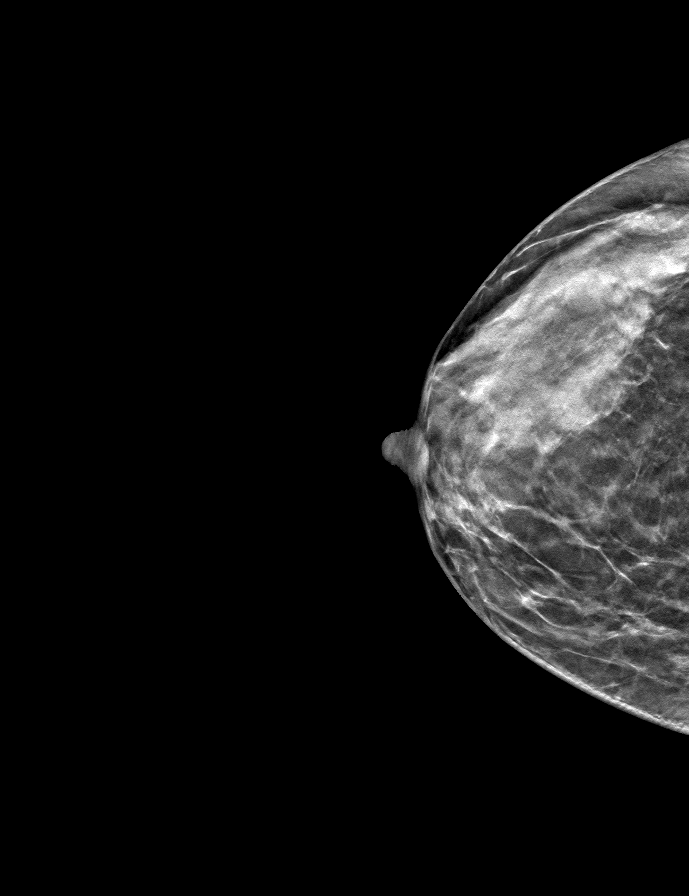

[L CC tomo · tomo slice 23/46.0]
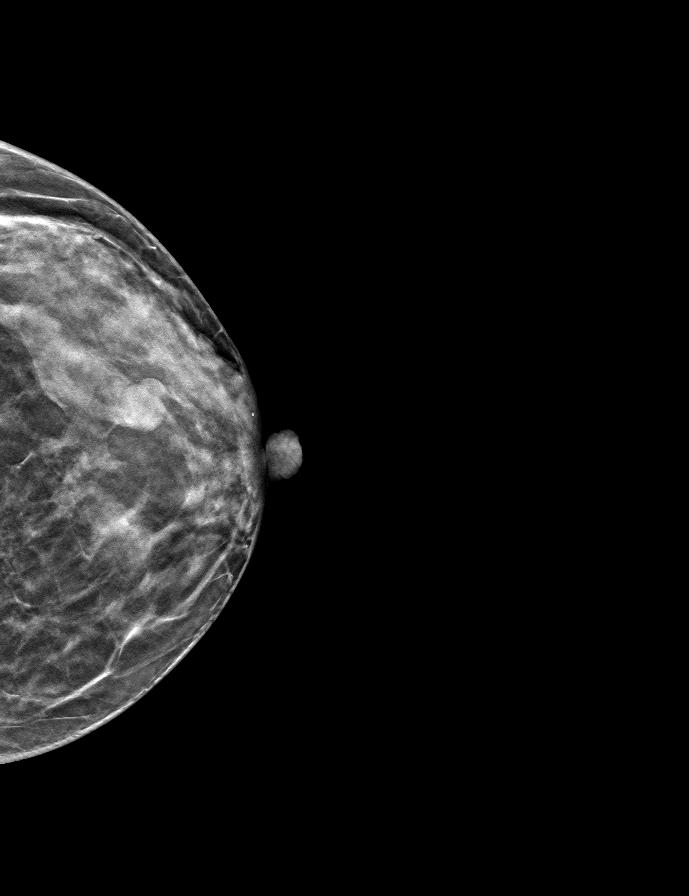

[R MLO tomo · tomo slice 25/49.0]
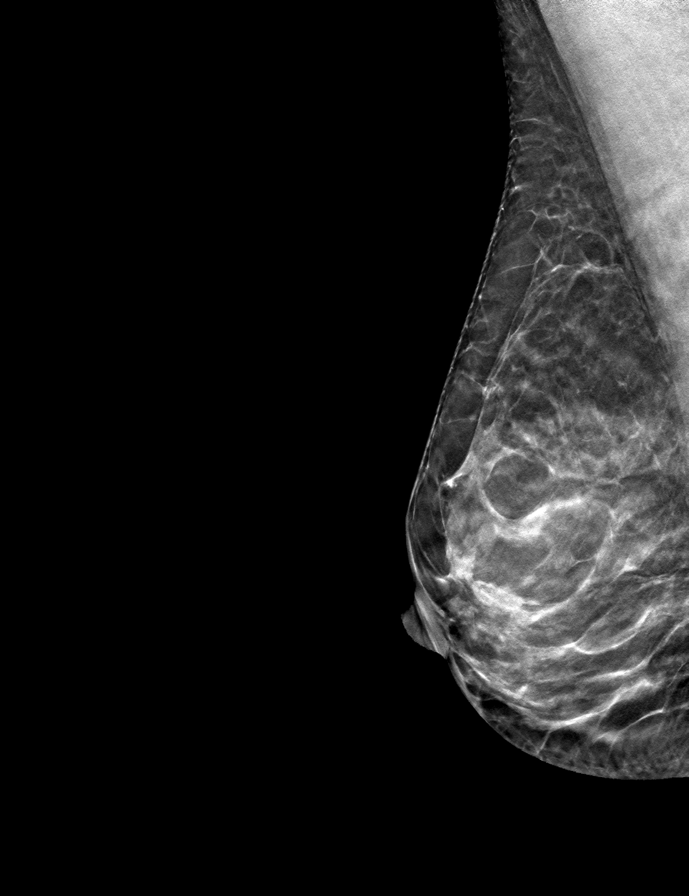

[9 of 24 positions shown; findings below may reference images not displayed]

ACR Breast Density Category c: The breast tissue is heterogeneously
dense, which may obscure small masses.
FINDINGS: There are no findings suspicious for malignancy. Images were
processed with CAD.
IMPRESSION: No mammographic evidence of malignancy. A result letter of this
screening mammogram will be mailed directly to the patient.

RECOMMENDATION:
Screening mammogram in one year. (Code:[id number]-[id number])

BI-RADS CATEGORY  1: Negative.

## 2020-01-01 ENCOUNTER — Encounter: Payer: Self-pay | Admitting: Certified Nurse Midwife

## 2020-02-07 ENCOUNTER — Other Ambulatory Visit: Payer: Self-pay

## 2020-02-07 ENCOUNTER — Other Ambulatory Visit (INDEPENDENT_AMBULATORY_CARE_PROVIDER_SITE_OTHER): Payer: 59

## 2020-02-07 DIAGNOSIS — E559 Vitamin D deficiency, unspecified: Secondary | ICD-10-CM

## 2020-02-08 LAB — VITAMIN D 25 HYDROXY (VIT D DEFICIENCY, FRACTURES): Vit D, 25-Hydroxy: 39.6 ng/mL (ref 30.0–100.0)

## 2020-11-04 NOTE — Progress Notes (Signed)
55 y.o. [id number] Married [place] American female here for annual exam.     Had a 3 day period, light. No cramping, no other bleeding Last documented LMP 10/24/19 (prior to most recent LMP of 08/12/2020) but patient states could be wrong, not good with keeping up with periods. Had [facility] 10/2019= 120 Denies any post coital bleeding.  Works in Air[employer] for [employer] in [employer]  Patient's last menstrual period was 08/12/2020.          Sexually active: Yes.    The current method of family planning is vasectomy.    Exercising: No.  exercise Smoker:  no  Health Maintenance: Pap:  09-15-18 neg HPV HR neg History of abnormal Pap:  no MMG:  09-11-2019 bilateral & left breast u/s category c birads 2:neg Colonoscopy:  2020 neg per patient BMD:   none TDaP:  2014 Gardasil:   n/a Covid-19: pfizer Hep C testing: hep c neg 2019 Screening Labs: up to date   reports that she has never smoked. She has never used smokeless tobacco. She reports that she does not drink alcohol and does not use drugs.  Past Medical History:  Diagnosis Date  . STD (sexually transmitted disease) 1988   tx'd for Chlamydia    History reviewed. No pertinent surgical history.  No current outpatient medications on file.   No current facility-administered medications for this visit.    Family History  Problem Relation Age of Onset  . Hyperlipidemia Father   . Heart attack Father   . Hypertension Maternal Grandmother     Review of Systems  Constitutional: Negative.   HENT: Negative.   Eyes: Negative.   Respiratory: Negative.   Cardiovascular: Negative.   Gastrointestinal: Negative.   Endocrine: Negative.   Genitourinary: Negative.   Musculoskeletal: Negative.   Skin: Negative.   Allergic/Immunologic: Negative.   Neurological: Negative.   Hematological: Negative.   Psychiatric/Behavioral: Negative.     Exam:   BP 110/72   Pulse 68   Resp 16   Ht 5' 6.75" (1.695 m)   Wt 144 lb (65.3 kg)    LMP 08/12/2020   BMI 22.72 kg/m   Height: 5' 6.75" (169.5 cm)  General appearance: alert, cooperative and appears stated age, no acute distress Head: Normocephalic, without obvious abnormality Neck: no adenopathy, thyroid normal to inspection and palpation Lungs: clear to auscultation bilaterally Breasts: Normal to palpation without dominant masses, Taught monthly breast self examination Heart: regular rate and rhythm Abdomen: soft, non-tender; no masses,  no organomegaly Extremities: extremities normal, no edema Skin: No rashes or lesions Lymph nodes: Cervical, supraclavicular, and axillary nodes normal. No abnormal inguinal nodes palpated Neurologic: Grossly normal   Pelvic: External genitalia:  no lesions              Urethra:  normal appearing urethra with no masses, tenderness or lesions              Bartholins and Skenes: normal                 Vagina: normal appearing vagina, appropriate for age, normal appearing discharge, no lesions              Cervix: neg cervical motion tenderness, no visible lesions             Bimanual Exam:   Uterus:  normal size, contour, position, consistency, mobility, non-tender              Adnexa: no mass, fullness, tenderness  Joy, CMA Chaperone was present for exam.  A:  Well Woman with normal exam  Hx vit D deficiency  P:   Pap : co-testing due 2024  Mammogram: due now, pt will call to schedule  Labs:CBC,CMP, vit D  Medications: no new medication recommendations

## 2020-11-07 ENCOUNTER — Ambulatory Visit: Payer: 59 | Admitting: Nurse Practitioner

## 2020-11-07 ENCOUNTER — Encounter: Payer: Self-pay | Admitting: Nurse Practitioner

## 2020-11-07 ENCOUNTER — Other Ambulatory Visit: Payer: Self-pay

## 2020-11-07 VITALS — BP 110/72 | HR 68 | Resp 16 | Ht 66.75 in | Wt 144.0 lb

## 2020-11-07 DIAGNOSIS — Z01419 Encounter for gynecological examination (general) (routine) without abnormal findings: Secondary | ICD-10-CM

## 2020-11-07 DIAGNOSIS — E559 Vitamin D deficiency, unspecified: Secondary | ICD-10-CM

## 2020-11-07 NOTE — Patient Instructions (Signed)

## 2020-11-08 ENCOUNTER — Other Ambulatory Visit: Payer: Self-pay | Admitting: Nurse Practitioner

## 2020-11-08 ENCOUNTER — Encounter: Payer: Self-pay | Admitting: Nurse Practitioner

## 2020-11-08 DIAGNOSIS — N950 Postmenopausal bleeding: Secondary | ICD-10-CM

## 2020-11-08 LAB — CBC
HCT: 38.9 % (ref 35.0–45.0)
Hemoglobin: 13.5 g/dL (ref 11.7–15.5)
MCH: 30.5 pg (ref 27.0–33.0)
MCHC: 34.7 g/dL (ref 32.0–36.0)
MCV: 88 fL (ref 80.0–100.0)
MPV: 10.2 fL (ref 7.5–12.5)
Platelets: 273 10*3/uL (ref 140–400)
RBC: 4.42 10*6/uL (ref 3.80–5.10)
RDW: 12.6 % (ref 11.0–15.0)
WBC: 3.8 10*3/uL (ref 3.8–10.8)

## 2020-11-08 LAB — LIPID PANEL
Cholesterol: 205 mg/dL — ABNORMAL HIGH (ref <200)
HDL: 78 mg/dL (ref >=50)
LDL Cholesterol (Calc): 112 mg/dL (calc) — ABNORMAL HIGH
Non-HDL Cholesterol (Calc): 127 mg/dL (calc) (ref <130)
Total CHOL/HDL Ratio: 2.6 (calc) (ref <5.0)
Triglycerides: 64 mg/dL (ref <150)

## 2020-11-08 LAB — COMPREHENSIVE METABOLIC PANEL
AG Ratio: 1.6 (calc) (ref 1.0–2.5)
ALT: 12 U/L (ref 6–29)
AST: 15 U/L (ref 10–35)
Albumin: 4.7 g/dL (ref 3.6–5.1)
Alkaline phosphatase (APISO): 57 U/L (ref 37–153)
BUN: 12 mg/dL (ref 7–25)
CO2: 31 mmol/L (ref 20–32)
Calcium: 10.3 mg/dL (ref 8.6–10.4)
Chloride: 103 mmol/L (ref 98–110)
Creat: 0.83 mg/dL (ref 0.50–1.05)
Globulin: 3 g/dL (calc) (ref 1.9–3.7)
Glucose, Bld: 80 mg/dL (ref 65–99)
Potassium: 4.3 mmol/L (ref 3.5–5.3)
Sodium: 140 mmol/L (ref 135–146)
Total Bilirubin: 0.7 mg/dL (ref 0.2–1.2)
Total Protein: 7.7 g/dL (ref 6.1–8.1)

## 2020-11-08 LAB — VITAMIN D 25 HYDROXY (VIT D DEFICIENCY, FRACTURES): Vit D, 25-Hydroxy: 19 ng/mL — ABNORMAL LOW (ref 30–100)

## 2020-11-25 NOTE — Telephone Encounter (Signed)
I called patient and read her the unread My Chart message. Someone from appointment desk to call her to schedule u/s.

## 2020-12-05 ENCOUNTER — Telehealth: Payer: Self-pay

## 2020-12-05 NOTE — Telephone Encounter (Signed)
Spoke with patient regarding benefits for recommended Ultrasound. Patient acknowledges understanding of information presented. Patient stated that she would like to give it some more thought and would return call when she has made a decision on how she would like to proceed. Encounter closed.

## 2020-12-05 NOTE — Telephone Encounter (Signed)
Call to patient. Per DPR, OK to leave message on voicemail.   Left voicemail requesting a return call to [facility] to review benefits and schedule recommended Ultrasound with First Available Provider.

## 2021-04-18 ENCOUNTER — Other Ambulatory Visit: Payer: Self-pay | Admitting: Cardiology

## 2021-04-18 DIAGNOSIS — Z1231 Encounter for screening mammogram for malignant neoplasm of breast: Secondary | ICD-10-CM

## 2021-06-12 ENCOUNTER — Ambulatory Visit: Payer: 59

## 2021-07-14 ENCOUNTER — Other Ambulatory Visit: Payer: Self-pay

## 2021-07-14 ENCOUNTER — Ambulatory Visit
Admission: RE | Admit: 2021-07-14 | Discharge: 2021-07-14 | Disposition: A | Discharge status: Home / Self Care | Payer: 59 | Source: Ambulatory Visit | Attending: Cardiology | Admitting: Cardiology

## 2021-07-14 DIAGNOSIS — Z1231 Encounter for screening mammogram for malignant neoplasm of breast: Secondary | ICD-10-CM

## 2022-09-28 ENCOUNTER — Other Ambulatory Visit (HOSPITAL_COMMUNITY)
Admission: RE | Admit: 2022-09-28 | Discharge: 2022-09-28 | Disposition: A | Discharge status: Home / Self Care | Payer: BC Managed Care – PPO | Source: Ambulatory Visit | Attending: Nurse Practitioner | Admitting: Nurse Practitioner

## 2022-09-28 ENCOUNTER — Encounter: Payer: Self-pay | Admitting: Nurse Practitioner

## 2022-09-28 ENCOUNTER — Ambulatory Visit (INDEPENDENT_AMBULATORY_CARE_PROVIDER_SITE_OTHER): Payer: BC Managed Care – PPO | Admitting: Nurse Practitioner

## 2022-09-28 VITALS — BP 120/62 | HR 89 | Temp 98.4°F | Ht 66.5 in | Wt 159.0 lb

## 2022-09-28 DIAGNOSIS — Z124 Encounter for screening for malignant neoplasm of cervix: Secondary | ICD-10-CM | POA: Insufficient documentation

## 2022-09-28 DIAGNOSIS — Z139 Encounter for screening, unspecified: Secondary | ICD-10-CM

## 2022-09-28 DIAGNOSIS — R252 Cramp and spasm: Secondary | ICD-10-CM

## 2022-09-28 DIAGNOSIS — Z1231 Encounter for screening mammogram for malignant neoplasm of breast: Secondary | ICD-10-CM

## 2022-09-28 DIAGNOSIS — E782 Mixed hyperlipidemia: Secondary | ICD-10-CM | POA: Diagnosis not present

## 2022-09-28 DIAGNOSIS — E559 Vitamin D deficiency, unspecified: Secondary | ICD-10-CM | POA: Diagnosis not present

## 2022-09-28 DIAGNOSIS — Z0000 Encounter for general adult medical examination without abnormal findings: Secondary | ICD-10-CM

## 2022-09-28 DIAGNOSIS — Z7689 Persons encountering health services in other specified circumstances: Secondary | ICD-10-CM

## 2022-09-28 DIAGNOSIS — Z862 Personal history of diseases of the blood and blood-forming organs and certain disorders involving the immune mechanism: Secondary | ICD-10-CM

## 2022-09-28 DIAGNOSIS — Z2821 Immunization not carried out because of patient refusal: Secondary | ICD-10-CM

## 2022-09-28 NOTE — Patient Instructions (Signed)
Health Maintenance, Female Adopting a healthy lifestyle and getting preventive care are important in promoting health and wellness. Ask your health care provider about: The right schedule for you to have regular tests and exams. Things you can do on your own to prevent diseases and keep yourself healthy. What should I know about diet, weight, and exercise? Eat a healthy diet  Eat a diet that includes plenty of vegetables, fruits, low-fat dairy products, and lean protein. Do not eat a lot of foods that are high in solid fats, added sugars, or sodium. Maintain a healthy weight Body mass index (BMI) is used to identify weight problems. It estimates body fat based on height and weight. Your health care provider can help determine your BMI and help you achieve or maintain a healthy weight. Get regular exercise Get regular exercise. This is one of the most important things you can do for your health. Most adults should: Exercise for at least 150 minutes each week. The exercise should increase your heart rate and make you sweat (moderate-intensity exercise). Do strengthening exercises at least twice a week. This is in addition to the moderate-intensity exercise. Spend less time sitting. Even light physical activity can be beneficial. Watch cholesterol and blood lipids Have your blood tested for lipids and cholesterol at 54 years of age, then have this test every 5 years. Have your cholesterol levels checked more often if: Your lipid or cholesterol levels are high. You are older than 54 years of age. You are at high risk for heart disease. What should I know about cancer screening? Depending on your health history and family history, you may need to have cancer screening at various ages. This may include screening for: Breast cancer. Cervical cancer. Colorectal cancer. Skin cancer. Lung cancer. What should I know about heart disease, diabetes, and high blood pressure? Blood pressure and heart  disease High blood pressure causes heart disease and increases the risk of stroke. This is more likely to develop in people who have high blood pressure readings or are overweight. Have your blood pressure checked: Every 3-5 years if you are 18-39 years of age. Every year if you are 40 years old or older. Diabetes Have regular diabetes screenings. This checks your fasting blood sugar level. Have the screening done: Once every three years after age 40 if you are at a normal weight and have a low risk for diabetes. More often and at a younger age if you are overweight or have a high risk for diabetes. What should I know about preventing infection? Hepatitis B If you have a higher risk for hepatitis B, you should be screened for this virus. Talk with your health care provider to find out if you are at risk for hepatitis B infection. Hepatitis C Testing is recommended for: Everyone born from 1945 through 1965. Anyone with known risk factors for hepatitis C. Sexually transmitted infections (STIs) Get screened for STIs, including gonorrhea and chlamydia, if: You are sexually active and are younger than 54 years of age. You are older than 54 years of age and your health care provider tells you that you are at risk for this type of infection. Your sexual activity has changed since you were last screened, and you are at increased risk for chlamydia or gonorrhea. Ask your health care provider if you are at risk. Ask your health care provider about whether you are at high risk for HIV. Your health care provider may recommend a prescription medicine to help prevent HIV   infection. If you choose to take medicine to prevent HIV, you should first get tested for HIV. You should then be tested every 3 months for as long as you are taking the medicine. Pregnancy If you are about to stop having your period (premenopausal) and you may become pregnant, seek counseling before you get pregnant. Take 400 to 800  micrograms (mcg) of folic acid every day if you become pregnant. Ask for birth control (contraception) if you want to prevent pregnancy. Osteoporosis and menopause Osteoporosis is a disease in which the bones lose minerals and strength with aging. This can result in bone fractures. If you are 65 years old or older, or if you are at risk for osteoporosis and fractures, ask your health care provider if you should: Be screened for bone loss. Take a calcium or vitamin D supplement to lower your risk of fractures. Be given hormone replacement therapy (HRT) to treat symptoms of menopause. Follow these instructions at home: Alcohol use Do not drink alcohol if: Your health care provider tells you not to drink. You are pregnant, may be pregnant, or are planning to become pregnant. If you drink alcohol: Limit how much you have to: 0-1 drink a day. Know how much alcohol is in your drink. In the [place] one drink equals one 12 oz bottle of beer (355 mL), one 5 oz glass of wine (148 mL), or one 1 oz glass of hard liquor (44 mL). Lifestyle Do not use any products that contain nicotine or tobacco. These products include cigarettes, chewing tobacco, and vaping devices, such as e-cigarettes. If you need help quitting, ask your health care provider. Do not use street drugs. Do not share needles. Ask your health care provider for help if you need support or information about quitting drugs. General instructions Schedule regular health, dental, and eye exams. Stay current with your vaccines. Tell your health care provider if: You often feel depressed. You have ever been abused or do not feel safe at home. Summary Adopting a healthy lifestyle and getting preventive care are important in promoting health and wellness. Follow your health care provider's instructions about healthy diet, exercising, and getting tested or screened for diseases. Follow your health care provider's instructions on monitoring your  cholesterol and blood pressure. This information is not intended to replace advice given to you by your health care provider. Make sure you discuss any questions you have with your health care provider. Document Revised: 02/17/2021 Document Reviewed: 02/17/2021 Elsevier Patient Education  2023 Elsevier Inc.  High Cholesterol  High cholesterol is a condition in which the blood has high levels of a white, waxy substance similar to fat (cholesterol). The liver makes all the cholesterol that the body needs. The human body needs small amounts of cholesterol to help build cells. A person gets extra or excess cholesterol from the food that he or she eats. The blood carries cholesterol from the liver to the rest of the body. If you have high cholesterol, deposits (plaques) may build up on the walls of your arteries. Arteries are the blood vessels that carry blood away from your heart. These plaques make the arteries narrow and stiff. Cholesterol plaques increase your risk for heart attack and stroke. Work with your health care provider to keep your cholesterol levels in a healthy range. What increases the risk? The following factors may make you more likely to develop this condition: Eating foods that are high in animal fat (saturated fat) or cholesterol. Being overweight. Not getting enough   exercise. A family history of high cholesterol (familial hypercholesterolemia). Use of tobacco products. Having diabetes. What are the signs or symptoms? In most cases, high cholesterol does not usually cause any symptoms. In severe cases, very high cholesterol levels can cause: Fatty bumps under the skin (xanthomas). A white or gray ring around the black center (pupil) of the eye. How is this diagnosed? This condition may be diagnosed based on the results of a blood test. If you are older than 54 years of age, your health care provider may check your cholesterol levels every 4-6 years. You may be checked more  often if you have high cholesterol or other risk factors for heart disease. The blood test for cholesterol measures: "Bad" cholesterol, or LDL cholesterol. This is the main type of cholesterol that causes heart disease. The desired level is less than 100 mg/dL (2.59 mmol/L). "Good" cholesterol, or HDL cholesterol. HDL helps protect against heart disease by cleaning the arteries and carrying the LDL to the liver for processing. The desired level for HDL is 60 mg/dL (1.55 mmol/L) or higher. Triglycerides. These are fats that your body can store or burn for energy. The desired level is less than 150 mg/dL (1.69 mmol/L). Total cholesterol. This measures the total amount of cholesterol in your blood and includes LDL, HDL, and triglycerides. The desired level is less than 200 mg/dL (5.17 mmol/L). How is this treated? Treatment for high cholesterol starts with lifestyle changes, such as diet and exercise. Diet changes. You may be asked to eat foods that have more fiber and less saturated fats or added sugar. Lifestyle changes. These may include regular exercise, maintaining a healthy weight, and quitting use of tobacco products. Medicines. These are given when diet and lifestyle changes have not worked. You may be prescribed a statin medicine to help lower your cholesterol levels. Follow these instructions at home: Eating and drinking  Eat a healthy, balanced diet. This diet includes: Daily servings of a variety of fresh, frozen, or canned fruits and vegetables. Daily servings of whole grain foods that are rich in fiber. Foods that are low in saturated fats and trans fats. These include poultry and fish without skin, lean cuts of meat, and low-fat dairy products. A variety of fish, especially oily fish that contain omega-3 fatty acids. Aim to eat fish at least 2 times a week. Avoid foods and drinks that have added sugar. Use healthy cooking methods, such as roasting, grilling, broiling, baking,  poaching, steaming, and stir-frying. Do not fry your food except for stir-frying. If you drink alcohol: Limit how much you have to: 0-1 drink a day for women who are not pregnant. 0-2 drinks a day for men. Know how much alcohol is in a drink. In the [place] one drink equals one 12 oz bottle of beer (355 mL), one 5 oz glass of wine (148 mL), or one 1 oz glass of hard liquor (44 mL). Lifestyle  Get regular exercise. Aim to exercise for a total of 150 minutes a week. Increase your activity level by doing activities such as gardening, walking, and taking the stairs. Do not use any products that contain nicotine or tobacco. These products include cigarettes, chewing tobacco, and vaping devices, such as e-cigarettes. If you need help quitting, ask your health care provider. General instructions Take over-the-counter and prescription medicines only as told by your health care provider. Keep all follow-up visits. This is important. Where to find more information American Heart Association: www.heart.org National Heart, Lung, and Blood Institute:   www.nhlbi.nih.gov Contact a health care provider if: You have trouble achieving or maintaining a healthy diet or weight. You are starting an exercise program. You are unable to stop smoking. Get help right away if: You have chest pain. You have trouble breathing. You have discomfort or pain in your jaw, neck, back, shoulder, or arm. You have any symptoms of a stroke. "BE FAST" is an easy way to remember the main warning signs of a stroke: B - Balance. Signs are dizziness, sudden trouble walking, or loss of balance. E - Eyes. Signs are trouble seeing or a sudden change in vision. F - Face. Signs are sudden weakness or numbness of the face, or the face or eyelid drooping on one side. A - Arms. Signs are weakness or numbness in an arm. This happens suddenly and usually on one side of the body. S - Speech. Signs are sudden trouble speaking, slurred speech, or  trouble understanding what people say. T - Time. Time to call emergency services. Write down what time symptoms started. You have other signs of a stroke, such as: A sudden, severe headache with no known cause. Nausea or vomiting. Seizure. These symptoms may represent a serious problem that is an emergency. Do not wait to see if the symptoms will go away. Get medical help right away. Call your local emergency services (911 in the [place] Do not drive yourself to the hospital. Summary Cholesterol plaques increase your risk for heart attack and stroke. Work with your health care provider to keep your cholesterol levels in a healthy range. Eat a healthy, balanced diet, get regular exercise, and maintain a healthy weight. Do not use any products that contain nicotine or tobacco. These products include cigarettes, chewing tobacco, and vaping devices, such as e-cigarettes. Get help right away if you have any symptoms of a stroke. This information is not intended to replace advice given to you by your health care provider. Make sure you discuss any questions you have with your health care provider. Document Revised: 05/01/2022 Document Reviewed: 12/02/2020 Elsevier Patient Education  2023 Elsevier Inc.  

## 2022-09-28 NOTE — Progress Notes (Signed)
I,Tianna Badgett,acting as a Education administrator for [employer], FNP.,have documented all relevant documentation on the behalf of Minette Brine, FNP,as directed by  Minette Brine, FNP while in the presence of Minette Brine, [place]. Subjective:     Patient ID: Tiffany Higgins , female    DOB: October 22, 1965 , 56 y.o.   MRN: [id number]   Chief Complaint  Patient presents with   Establish Care    HPI  Patient presents today to establish care. She has not had a PCP in at least 5 years. She was also going to [place] for her women's care. She would like her physical today. She works in [employer]. Married with 4 adult children and one miscarriage. 77 y/o daughter - nonhodgkins lymphoma cancer survivor diagnosed at 54 y/o, 93 y/o  daughter - new finding of abnormal EKG and is scheduled to see Cardiology, 46 y/o son and 33 y/o son. LMP - November 2021  She has taken iron supplement for anemia. She had a colonoscopy last year and did not have anemia. She has a history of vitamin d deficiency not taking a supplement at this time.   Sister (older sister - Coralyn Mark) - tachycardia had surgery     Past Medical History:  Diagnosis Date   Anemia    STD (sexually transmitted disease) 10/12/1986   tx'd for Chlamydia     Family History  Problem Relation Age of Onset   Stroke Mother    Hypertension Mother    Hyperlipidemia Father    Heart attack Father    Hypertension Maternal Grandmother      Current Outpatient Medications:    Vitamin D, Ergocalciferol, (DRISDOL) 1.25 MG (50000 UNIT) CAPS capsule, Take 1 capsule (50,000 Units total) by mouth every 7 (seven) days., Disp: 12 capsule, Rfl: 1   No Known Allergies    The patient states she is post menopausal status.  No LMP recorded. (Menstrual status: Other).. Negative for Dysmenorrhea and Negative for Menorrhagia. Negative for: breast discharge, breast lump(s), breast pain and breast self exam. Associated symptoms include abnormal vaginal bleeding. Pertinent  negatives include abnormal bleeding (hematology), anxiety, decreased libido, depression, difficulty falling sleep, dyspareunia, history of infertility, nocturia, sexual dysfunction, sleep disturbances, urinary incontinence, urinary urgency, vaginal discharge and vaginal itching. Diet regular - avoids red meat, mostly chicken and vegetables. She does admit to having a sweet tooth. The patient states her exercise level is minimal - she is aware she needs to exercise   The patient's tobacco use is:  Social History   Tobacco Use  Smoking Status Never  Smokeless Tobacco Never   She has been exposed to passive smoke. The patient's alcohol use is:  Social History   Substance and Sexual Activity  Alcohol Use No   Alcohol/week: 0.0 standard drinks of alcohol   Additional information: Last pap 2019, next one scheduled for today.    Review of Systems  Constitutional: Negative.   HENT: Negative.    Eyes: Negative.   Respiratory: Negative.    Cardiovascular: Negative.   Gastrointestinal: Negative.   Endocrine: Negative.   Genitourinary: Negative.   Allergic/Immunologic: Negative.   Neurological: Negative.   Hematological: Negative.   Psychiatric/Behavioral: Negative.       Today's Vitals   09/28/22 1010  BP: 120/62  Pulse: 89  Temp: 98.4 F (36.9 C)  TempSrc: Oral  Weight: 159 lb (72.1 kg)  Height: 5' 6.5" (1.689 m)   Body mass index is 25.28 kg/m.   Objective:  Physical Exam Vitals reviewed.  Exam conducted with a chaperone present.  Constitutional:      General: She is not in acute distress.    Appearance: Normal appearance. She is well-developed. She is obese.  HENT:     Head: Normocephalic and atraumatic.     Right Ear: Hearing, tympanic membrane, ear canal and external ear normal. There is no impacted cerumen.     Left Ear: Hearing, tympanic membrane, ear canal and external ear normal. There is no impacted cerumen.     Nose:     Comments: Deferred - masked     Mouth/Throat:     Comments: Deferred - masked Eyes:     General: Lids are normal.     Extraocular Movements: Extraocular movements intact.     Conjunctiva/sclera: Conjunctivae normal.     Pupils: Pupils are equal, round, and reactive to light.     Funduscopic exam:    Right eye: No papilledema.        Left eye: No papilledema.  Neck:     Thyroid: No thyroid mass.     Vascular: No carotid bruit.  Cardiovascular:     Rate and Rhythm: Normal rate and regular rhythm.     Pulses: Normal pulses.     Heart sounds: Normal heart sounds. No murmur heard. Pulmonary:     Effort: Pulmonary effort is normal. No respiratory distress.     Breath sounds: Normal breath sounds. No wheezing.  Chest:     Chest wall: No mass.  Breasts:    Tanner Score is 5.     Right: Normal. No mass or tenderness.     Left: Normal. No mass or tenderness.  Abdominal:     General: Abdomen is flat. Bowel sounds are normal. There is no distension.     Palpations: Abdomen is soft.     Tenderness: There is no abdominal tenderness.  Genitourinary:    General: Normal vulva.     Tanner stage (genital): 5.     Labia:        Right: No rash.        Left: No rash.      Vagina: Normal.     Cervix: Normal.     Uterus: Normal.      Adnexa: Right adnexa normal and left adnexa normal.     Rectum: Normal. Guaiac result negative.  Musculoskeletal:        General: No swelling or tenderness. Normal range of motion.     Cervical back: Full passive range of motion without pain, normal range of motion and neck supple.     Right lower leg: No edema.     Left lower leg: No edema.  Lymphadenopathy:     Upper Body:     Right upper body: No supraclavicular, axillary or pectoral adenopathy.     Left upper body: No supraclavicular, axillary or pectoral adenopathy.  Skin:    General: Skin is warm and dry.     Capillary Refill: Capillary refill takes less than 2 seconds.  Neurological:     General: No focal deficit present.      Mental Status: She is alert and oriented to person, place, and time.     Cranial Nerves: No cranial nerve deficit.     Sensory: No sensory deficit.  Psychiatric:        Mood and Affect: Mood normal.        Behavior: Behavior normal.        Thought Content: Thought content normal.  Judgment: Judgment normal.         Assessment And Plan:     1. Encounter for annual physical exam Behavior modifications discussed and diet history reviewed.   Pt will continue to exercise regularly and modify diet with low GI, plant based foods and decrease intake of processed foods.  Recommend intake of daily multivitamin, Vitamin D, and calcium.  Recommend mammogram and colonoscopy for preventive screenings, as well as recommend immunizations that include influenza, TDAP, and Shingles  2. Encounter for screening for cervical cancer - Cytology -Pap Smear  3. Encounter for screening mammogram for breast cancer Pt instructed on Self Breast Exam.According to ACOG guidelines Women aged 102 and older are recommended to get an annual mammogram. Form completed and given to patient contact the [facility] for appointment scheduing.  Pt encouraged to get annual mammogram - MM 3D SCREEN BREAST BILATERAL; Future  4. Influenza vaccination declined Patient declined influenza vaccination at this time. Patient is aware that influenza vaccine prevents illness in 70% of healthy people, and reduces hospitalizations to 30-70% in elderly. This vaccine is recommended annually. Education has been provided regarding the importance of this vaccine but patient still declined. Advised may receive this vaccine at local pharmacy or Health Dept.or vaccine clinic. Aware to provide a copy of the vaccination record if obtained from local pharmacy or Health Dept.  Pt is willing to accept risk associated with refusing vaccination.  5. Herpes zoster vaccination declined Declines shingrix, educated on disease process and is aware  if he changes his mind to notify office  6. Encounter for screening Will check for history of chicken pox, she is due for shingrix. However does not feel has has chicken pox - Varicella Zoster Abs, IgG/IgM  7. Vitamin D deficiency Will check vitamin D level and supplement as needed.    Also encouraged to spend 15 minutes in the sun daily.  - VITAMIN D 25 Hydroxy (Vit-D Deficiency, Fractures)  8. Mixed hyperlipidemia Comments: No current medications for previous history of hyperlipidemia.  Encouraged to eat a low-fat diet - CMP14+EGFR - Lipid panel  9. Cramping of hands Comments: Encouraged to increase water intake and can take over the counter magnesium. I will also check magnesium levels - Magnesium  10. History of anemia - CBC  11. Establishing care with new doctor, encounter for    Patient was given opportunity to ask questions. Patient verbalized understanding of the plan and was able to repeat key elements of the plan. All questions were answered to their satisfaction.   Minette Brine, FNP   I, Minette Brine, FNP, have reviewed all documentation for this visit. The documentation on 09/28/22 for the exam, diagnosis, procedures, and orders are all accurate and complete.   THE PATIENT IS ENCOURAGED TO PRACTICE SOCIAL DISTANCING DUE TO THE COVID-19 PANDEMIC.

## 2022-09-29 LAB — LIPID PANEL
Chol/HDL Ratio: 2.6 ratio (ref 0.0–4.4)
Cholesterol, Total: 208 mg/dL — ABNORMAL HIGH (ref 100–199)
HDL: 80 mg/dL (ref >39)
LDL Chol Calc (NIH): 117 mg/dL — ABNORMAL HIGH (ref 0–99)
Triglycerides: 59 mg/dL (ref 0–149)
VLDL Cholesterol Cal: 11 mg/dL (ref 5–40)

## 2022-09-29 LAB — CMP14+EGFR
ALT: 14 IU/L (ref 0–32)
AST: 19 IU/L (ref 0–40)
Albumin/Globulin Ratio: 1.9 (ref 1.2–2.2)
Albumin: 4.7 g/dL (ref 3.8–4.9)
Alkaline Phosphatase: 71 IU/L (ref 44–121)
BUN/Creatinine Ratio: 15 (ref 9–23)
BUN: 12 mg/dL (ref 6–24)
Bilirubin Total: 0.4 mg/dL (ref 0.0–1.2)
CO2: 24 mmol/L (ref 20–29)
Calcium: 9.8 mg/dL (ref 8.7–10.2)
Chloride: 104 mmol/L (ref 96–106)
Creatinine, Ser: 0.79 mg/dL (ref 0.57–1.00)
Globulin, Total: 2.5 g/dL (ref 1.5–4.5)
Glucose: 82 mg/dL (ref 70–99)
Potassium: 4.5 mmol/L (ref 3.5–5.2)
Sodium: 142 mmol/L (ref 134–144)
Total Protein: 7.2 g/dL (ref 6.0–8.5)
eGFR: 89 mL/min/{1.73_m2} (ref >59)

## 2022-09-29 LAB — CBC
Hematocrit: 36.4 % (ref 34.0–46.6)
Hemoglobin: 12.7 g/dL (ref 11.1–15.9)
MCH: 30.2 pg (ref 26.6–33.0)
MCHC: 34.9 g/dL (ref 31.5–35.7)
MCV: 87 fL (ref 79–97)
Platelets: 256 10*3/uL (ref 150–450)
RBC: 4.2 x10E6/uL (ref 3.77–5.28)
RDW: 12.2 % (ref 11.7–15.4)
WBC: 3.9 10*3/uL (ref 3.4–10.8)

## 2022-09-29 LAB — VARICELLA ZOSTER ABS, IGG/IGM
Varicella IgM: <0.91 index (ref 0.00–0.90)
Varicella zoster IgG: <135 index — ABNORMAL LOW (ref >165)

## 2022-09-29 LAB — VITAMIN D 25 HYDROXY (VIT D DEFICIENCY, FRACTURES): Vit D, 25-Hydroxy: 20.2 ng/mL — ABNORMAL LOW (ref 30.0–100.0)

## 2022-09-29 LAB — MAGNESIUM: Magnesium: 2 mg/dL (ref 1.6–2.3)

## 2022-10-07 ENCOUNTER — Other Ambulatory Visit: Payer: Self-pay | Admitting: Nurse Practitioner

## 2022-10-07 DIAGNOSIS — E559 Vitamin D deficiency, unspecified: Secondary | ICD-10-CM

## 2022-10-07 LAB — CYTOLOGY - PAP: Diagnosis: NEGATIVE

## 2022-10-07 MED ORDER — VITAMIN D (ERGOCALCIFEROL) 1.25 MG (50000 UNIT) PO CAPS: 50000.0000 [IU] | ORAL_CAPSULE | ORAL | 1 refills | Status: AC

## 2022-10-09 ENCOUNTER — Encounter: Payer: Self-pay | Admitting: Nurse Practitioner

## 2022-10-09 ENCOUNTER — Ambulatory Visit
Admission: RE | Admit: 2022-10-09 | Discharge: 2022-10-09 | Disposition: A | Discharge status: Home / Self Care | Payer: BC Managed Care – PPO | Source: Ambulatory Visit | Attending: Nurse Practitioner | Admitting: Nurse Practitioner

## 2022-10-09 DIAGNOSIS — Z1231 Encounter for screening mammogram for malignant neoplasm of breast: Secondary | ICD-10-CM

## 2022-10-14 ENCOUNTER — Other Ambulatory Visit: Payer: Self-pay | Admitting: Nurse Practitioner

## 2022-10-14 DIAGNOSIS — R928 Other abnormal and inconclusive findings on diagnostic imaging of breast: Secondary | ICD-10-CM

## 2022-10-23 ENCOUNTER — Ambulatory Visit
Admission: RE | Admit: 2022-10-23 | Discharge: 2022-10-23 | Disposition: A | Discharge status: Home / Self Care | Payer: BC Managed Care – PPO | Source: Ambulatory Visit | Attending: Nurse Practitioner | Admitting: Nurse Practitioner

## 2022-10-23 DIAGNOSIS — N6489 Other specified disorders of breast: Secondary | ICD-10-CM | POA: Diagnosis not present

## 2022-10-23 DIAGNOSIS — R928 Other abnormal and inconclusive findings on diagnostic imaging of breast: Secondary | ICD-10-CM

## 2022-12-01 ENCOUNTER — Ambulatory Visit: Payer: 59 | Admitting: Nurse Practitioner

## 2022-12-01 ENCOUNTER — Encounter: Payer: Self-pay | Admitting: Nurse Practitioner

## 2023-02-03 ENCOUNTER — Encounter: Payer: BC Managed Care – PPO | Admitting: Nurse Practitioner
